# Patient Record
Sex: Male | Born: 1950
Health system: Southern US, Community
[De-identification: ages and names within clinical notes are randomized; demographics above are authoritative.]

## PROBLEM LIST (undated history)

## (undated) DIAGNOSIS — J45909 Unspecified asthma, uncomplicated: Secondary | ICD-10-CM

## (undated) DIAGNOSIS — E785 Hyperlipidemia, unspecified: Secondary | ICD-10-CM

## (undated) DIAGNOSIS — I10 Essential (primary) hypertension: Secondary | ICD-10-CM

## (undated) HISTORY — DX: Unspecified asthma, uncomplicated: J45.909

## (undated) HISTORY — DX: Hyperlipidemia, unspecified: E78.5

---

## 2002-02-13 ENCOUNTER — Emergency Department (HOSPITAL_COMMUNITY): Admission: EM | Admit: 2002-02-13 | Discharge: 2002-02-13 | Payer: Self-pay | Admitting: Emergency Medicine

## 2002-02-13 ENCOUNTER — Encounter: Payer: Self-pay | Admitting: Emergency Medicine

## 2003-04-10 ENCOUNTER — Ambulatory Visit (HOSPITAL_COMMUNITY): Admission: RE | Admit: 2003-04-10 | Discharge: 2003-04-10 | Payer: Self-pay | Admitting: *Deleted

## 2004-10-21 ENCOUNTER — Encounter: Admission: RE | Admit: 2004-10-21 | Discharge: 2004-10-21 | Payer: Self-pay | Admitting: Occupational Medicine

## 2007-07-18 ENCOUNTER — Emergency Department (HOSPITAL_COMMUNITY): Admission: EM | Admit: 2007-07-18 | Discharge: 2007-07-18 | Payer: Self-pay | Admitting: Emergency Medicine

## 2009-07-24 ENCOUNTER — Ambulatory Visit: Payer: Self-pay | Admitting: Internal Medicine

## 2009-07-24 DIAGNOSIS — R5383 Other fatigue: Secondary | ICD-10-CM

## 2009-07-24 DIAGNOSIS — R5381 Other malaise: Secondary | ICD-10-CM | POA: Insufficient documentation

## 2009-07-24 LAB — CONVERTED CEMR LAB
ALT: 16 units/L (ref 0–53)
AST: 14 units/L (ref 0–37)
Albumin: 3.8 g/dL (ref 3.5–5.2)
Alkaline Phosphatase: 44 units/L (ref 39–117)
BUN: 17 mg/dL (ref 6–23)
Basophils Absolute: 0 10*3/uL (ref 0.0–0.1)
Basophils Relative: 0.2 % (ref 0.0–3.0)
Bilirubin, Direct: 0.2 mg/dL (ref 0.0–0.3)
CO2: 27 meq/L (ref 19–32)
Calcium: 8.8 mg/dL (ref 8.4–10.5)
Chloride: 107 meq/L (ref 96–112)
Creatinine, Ser: 1.1 mg/dL (ref 0.4–1.5)
Eosinophils Absolute: 0.1 10*3/uL (ref 0.0–0.7)
Eosinophils Relative: 2.4 % (ref 0.0–5.0)
GFR calc non Af Amer: 90.09 mL/min (ref >60)
Glucose, Bld: 91 mg/dL (ref 70–99)
HCT: 45 % (ref 39.0–52.0)
Hemoglobin: 15.4 g/dL (ref 13.0–17.0)
Lymphocytes Relative: 40.6 % (ref 12.0–46.0)
Lymphs Abs: 2 10*3/uL (ref 0.7–4.0)
MCHC: 34.2 g/dL (ref 30.0–36.0)
MCV: 89.8 fL (ref 78.0–100.0)
Monocytes Absolute: 0.5 10*3/uL (ref 0.1–1.0)
Monocytes Relative: 10.9 % (ref 3.0–12.0)
Neutro Abs: 2.3 10*3/uL (ref 1.4–7.7)
Neutrophils Relative %: 45.9 % (ref 43.0–77.0)
Platelets: 202 10*3/uL (ref 150.0–400.0)
Potassium: 4.1 meq/L (ref 3.5–5.1)
RBC: 5.01 M/uL (ref 4.22–5.81)
RDW: 14.5 % (ref 11.5–14.6)
Sodium: 140 meq/L (ref 135–145)
TSH: 0.28 microintl units/mL — ABNORMAL LOW (ref 0.35–5.50)
Total Bilirubin: 0.8 mg/dL (ref 0.3–1.2)
Total Protein: 6.4 g/dL (ref 6.0–8.3)
WBC: 5.1 10*3/uL (ref 4.5–10.5)

## 2009-07-25 ENCOUNTER — Encounter: Payer: Self-pay | Admitting: Internal Medicine

## 2010-03-31 NOTE — Letter (Signed)
Summary: Results Follow-up Letter  [facility]  [address] [place], [place] [address]   Phone: [phone]  Fax: [phone]    07/25/2009  [address] [place], [place]  [address]  Dear Mr. Passage,   The following are the results of your recent test(s):  Test     Result     CBC       normal Liver/kidney   normal Thyroid     slightly abnormal   _________________________________________________________  Please call for an appointment in 3-4 weeks _________________________________________________________ _________________________________________________________ _________________________________________________________  Sincerely,  Sanda Linger MD [facility]

## 2010-03-31 NOTE — Assessment & Plan Note (Signed)
Summary: NEW [employer] PT--PKG/OFF--#---STC   Vital Signs:  Patient profile:   60 year old male Height:      72 inches Weight:      186 pounds BMI:     25.32 O2 Sat:      96 % on Room air Temp:     98.4 degrees F oral Pulse rate:   75 / minute Pulse rhythm:   regular Resp:     18 per minute BP sitting:   120 / 80  (left arm) Cuff size:   large  Vitals Entered By: Rock Nephew CMA (Jul 24, 2009 3:58 PM)  Nutrition Counseling: Patient's BMI is greater than 25 and therefore counseled on weight management options.  O2 Flow:  Room air  Primary Care Provider:  Etta Grandchild MD   History of Present Illness: New to me he complains of fatigue for 3 years. He had says that he had a physical done by ? MD in [place] one year ago but he does not know what tests were done.  Preventive Screening-Counseling & Management  Alcohol-Tobacco     Alcohol drinks/day: 0     Smoking Status: never  Caffeine-Diet-Exercise     Does Patient Exercise: yes  Hep-HIV-STD-Contraception     Hepatitis Risk: no risk noted     HIV Risk: no risk noted     STD Risk: no risk noted     TSE monthly: yes  Safety-Violence-Falls     Seat Belt Use: yes     Helmet Use: yes     Firearms in the Home: no firearms in the home     Smoke Detectors: yes     Violence in the Home: no risk noted      Sexual History:  currently monogamous.        Drug Use:  no.        Blood Transfusions:  no.    Current Medications (verified): 1)  Fish Oil 2)  Multi Vitamin  Allergies (verified): No Known Drug Allergies  Past History:  Past Medical History: Unremarkable  Past Surgical History: Denies surgical history  Family History: Family History of Alcoholism/Addiction  Social History: Occupation: Midwife Single Never Smoked Alcohol use-no Drug use-no Regular exercise-yes Smoking Status:  never Hepatitis Risk:  no risk noted HIV Risk:  no risk noted STD Risk:  no risk noted Seat Belt Use:  yes Sexual  History:  currently monogamous Blood Transfusions:  no Drug Use:  no Does Patient Exercise:  yes  Review of Systems  The patient denies anorexia, fever, weight loss, weight gain, chest pain, syncope, dyspnea on exertion, peripheral edema, prolonged cough, headaches, hemoptysis, abdominal pain, hematuria, difficulty walking, depression, enlarged lymph nodes, and angioedema.   General:  Complains of fatigue; denies chills, fever, loss of appetite, malaise, sleep disorder, sweats, weakness, and weight loss.  Physical Exam  General:  alert, well-developed, well-nourished, well-hydrated, appropriate dress, normal appearance, healthy-appearing, cooperative to examination, and good hygiene.  alert, well-developed, well-nourished, well-hydrated, appropriate dress, normal appearance, healthy-appearing, cooperative to examination, and good hygiene.   Head:  normocephalic, atraumatic, no abnormalities observed, and no abnormalities palpated.  normocephalic, atraumatic, no abnormalities observed, and no abnormalities palpated.   Eyes:  vision grossly intact, no injection, and no nystagmus.  vision grossly intact, no injection, and no nystagmus.   Mouth:  Oral mucosa and oropharynx without lesions or exudates.  Teeth in good repair. Neck:  supple, full ROM, no masses, no thyromegaly, no JVD, normal carotid  upstroke, no carotid bruits, no cervical lymphadenopathy, and no neck tenderness.  supple, full ROM, no masses, no thyromegaly, no JVD, normal carotid upstroke, no carotid bruits, no cervical lymphadenopathy, and no neck tenderness.   Lungs:  normal respiratory effort, no intercostal retractions, no accessory muscle use, normal breath sounds, no dullness, no fremitus, no crackles, and no wheezes.  normal respiratory effort, no intercostal retractions, no accessory muscle use, normal breath sounds, no dullness, no fremitus, no crackles, and no wheezes.   Heart:  normal rate, regular rhythm, no murmur, no  gallop, no rub, and no JVD.  normal rate, regular rhythm, no murmur, no gallop, no rub, and no JVD.   Abdomen:  soft, non-tender, normal bowel sounds, no distention, no masses, no guarding, no rigidity, no rebound tenderness, no abdominal hernia, no inguinal hernia, no hepatomegaly, and no splenomegaly.  soft, non-tender, normal bowel sounds, no distention, no masses, no guarding, no rigidity, no rebound tenderness, no abdominal hernia, no inguinal hernia, no hepatomegaly, and no splenomegaly.   Msk:  normal ROM, no joint tenderness, no joint swelling, no joint warmth, no redness over joints, no joint deformities, no joint instability, no crepitation, and no muscle atrophy.  normal ROM, no joint tenderness, no joint swelling, no joint warmth, no redness over joints, no joint deformities, no joint instability, no crepitation, and no muscle atrophy.   Pulses:  R and L carotid,radial,femoral,dorsalis pedis and posterior tibial pulses are full and equal bilaterally Extremities:  No clubbing, cyanosis, edema, or deformity noted with normal full range of motion of all joints.   Neurologic:  No cranial nerve deficits noted. Station and gait are normal. Plantar reflexes are down-going bilaterally. DTRs are symmetrical throughout. Sensory, motor and coordinative functions appear intact. Skin:  turgor normal, color normal, no rashes, no suspicious lesions, no ecchymoses, no petechiae, no purpura, no ulcerations, and no edema.  turgor normal, color normal, no rashes, no suspicious lesions, no ecchymoses, no petechiae, no purpura, no ulcerations, and no edema.   Cervical Nodes:  no anterior cervical adenopathy and no posterior cervical adenopathy.  no anterior cervical adenopathy and no posterior cervical adenopathy.   Axillary Nodes:  no R axillary adenopathy and no L axillary adenopathy.  no R axillary adenopathy and no L axillary adenopathy.   Inguinal Nodes:  no R inguinal adenopathy and no L inguinal adenopathy.   no R inguinal adenopathy and no L inguinal adenopathy.   Psych:  Cognition and judgment appear intact. Alert and cooperative with normal attention span and concentration. No apparent delusions, illusions, hallucinations   Impression & Recommendations:  Problem # 1:  FATIGUE, ACUTE (ICD-780.79) Assessment New this does not sound pathological since he has had fatigue for years with no other systemic complaints, will look at labs today for organic illness. Orders: Venipuncture ([address]454) TLB-BMP (Basic Metabolic Panel-BMET) (80048-METABOL) TLB-CBC Platelet - w/Differential (85025-CBCD) TLB-Hepatic/Liver Function Pnl (80076-HEPATIC) TLB-TSH (Thyroid Stimulating Hormone) (84443-TSH)  Complete Medication List: 1)  Fish Oil  2)  Multi Vitamin   Patient Instructions: 1)  Please schedule a follow-up appointment in 1 month for a full physical with fasting labs.   Tetanus/Td Immunization History:    Tetanus/Td # 1:  Tdap (06/30/2006)

## 2010-11-25 LAB — URINE CULTURE
Colony Count: NO GROWTH
Culture: NO GROWTH

## 2010-11-25 LAB — POCT URINALYSIS DIP (DEVICE)
Glucose, UA: NEGATIVE
Ketones, ur: 40 — AB
Nitrite: NEGATIVE
Operator id: 282151
Protein, ur: 100 — AB
Specific Gravity, Urine: 1.02
Urobilinogen, UA: 1
pH: 5.5

## 2010-11-25 LAB — GC/CHLAMYDIA PROBE AMP, GENITAL: GC Probe Amp, Genital: NEGATIVE

## 2012-12-24 ENCOUNTER — Emergency Department (HOSPITAL_COMMUNITY)
Admission: EM | Admit: 2012-12-24 | Discharge: 2012-12-24 | Discharge status: Against Medical Advice | Payer: BC Managed Care – PPO | Attending: Emergency Medicine | Admitting: Emergency Medicine

## 2012-12-24 ENCOUNTER — Emergency Department (HOSPITAL_COMMUNITY): Payer: BC Managed Care – PPO

## 2012-12-24 ENCOUNTER — Encounter (HOSPITAL_COMMUNITY): Payer: Self-pay | Admitting: Emergency Medicine

## 2012-12-24 DIAGNOSIS — M25519 Pain in unspecified shoulder: Secondary | ICD-10-CM | POA: Insufficient documentation

## 2012-12-24 DIAGNOSIS — R079 Chest pain, unspecified: Secondary | ICD-10-CM | POA: Diagnosis present

## 2012-12-24 DIAGNOSIS — M79602 Pain in left arm: Secondary | ICD-10-CM

## 2012-12-24 DIAGNOSIS — N289 Disorder of kidney and ureter, unspecified: Secondary | ICD-10-CM | POA: Diagnosis present

## 2012-12-24 LAB — POCT I-STAT TROPONIN I: Troponin i, poc: 0.01 ng/mL (ref 0.00–0.08)

## 2012-12-24 LAB — POCT I-STAT, CHEM 8
BUN: 20 mg/dL (ref 6–23)
Calcium, Ion: 1.2 mmol/L (ref 1.13–1.30)
Chloride: 105 mEq/L (ref 96–112)
HCT: 47 % (ref 39.0–52.0)
Sodium: 141 mEq/L (ref 135–145)
TCO2: 23 mmol/L (ref 0–100)

## 2012-12-24 LAB — CBC
MCH: 30.2 pg (ref 26.0–34.0)
Platelets: 181 10*3/uL (ref 150–400)
RBC: 5.07 MIL/uL (ref 4.22–5.81)
RDW: 13.5 % (ref 11.5–15.5)
WBC: 5 10*3/uL (ref 4.0–10.5)

## 2012-12-24 MED ORDER — ASPIRIN 81 MG PO CHEW: 81.0000 mg | CHEWABLE_TABLET | Freq: Every day | ORAL | Status: DC

## 2012-12-24 MED ORDER — CYCLOBENZAPRINE HCL 10 MG PO TABS: 10.0000 mg | ORAL_TABLET | Freq: Two times a day (BID) | ORAL | Status: DC | PRN

## 2012-12-24 MED ORDER — ASPIRIN 81 MG PO CHEW
324.0000 mg | CHEWABLE_TABLET | Freq: Once | ORAL | Status: AC
  Administered 2012-12-24: 324 mg via ORAL
  Filled 2012-12-24: qty 4

## 2012-12-24 MED ORDER — ONDANSETRON HCL 4 MG/2ML IJ SOLN
4.0000 mg | Freq: Once | INTRAMUSCULAR | Status: AC
  Administered 2012-12-24: 4 mg via INTRAVENOUS
  Filled 2012-12-24: qty 2

## 2012-12-24 MED ORDER — MORPHINE SULFATE 4 MG/ML IJ SOLN
4.0000 mg | Freq: Once | INTRAMUSCULAR | Status: AC
  Administered 2012-12-24: 4 mg via INTRAVENOUS
  Filled 2012-12-24: qty 1

## 2012-12-24 NOTE — ED Notes (Signed)
Pt arrived to ED with a complaint of arm pain.  Pt states that it feels like an electrical pulse that runs down from his shoulder to his hand intermittently.  Pt states the last time he felt it was prior to entrance to the ED.  Pt states pain has been effecting him for 2 weeks

## 2012-12-24 NOTE — ED Notes (Signed)
Patient left AMA.  He was notified of the dangers of leaving and was instructed to return if his symptoms return He was not showing any signs of distress on DC

## 2012-12-24 NOTE — ED Provider Notes (Addendum)
CSN: [id number]     Arrival date & time 12/24/12  0117 History   First MD Initiated Contact with Patient 12/24/12 0210     Chief Complaint  Patient presents with  . Extremity Pain   (Consider location/radiation/quality/duration/timing/severity/associated sxs/prior Treatment) HPI History provided by patient. Has been having left arm pain for the last few days, described as soreness. Tonight he also developed associated chest pain. No known aggravating or alleviating factors. No shortness of breath. No nausea vomiting. No diaphoresis. Patient has not seen a doctor in some time. He denies any history of diabetes, hypertension hyperlipidemia and known history of heart disease. Symptoms mild/moderate in severity.  History reviewed. No pertinent past medical history. History reviewed. No pertinent past surgical history. History reviewed. No pertinent family history. History  Substance Use Topics  . Smoking status: Never Smoker   . Smokeless tobacco: Not on file  . Alcohol Use: No    Review of Systems  Constitutional: Negative for fever and chills.  Respiratory: Negative for shortness of breath.   Cardiovascular: Positive for chest pain.  Gastrointestinal: Negative for abdominal pain.  Genitourinary: Negative for dysuria.  Musculoskeletal: Negative for back pain, neck pain and neck stiffness.  Skin: Negative for rash.  Neurological: Negative for headaches.  All other systems reviewed and are negative.    Allergies  Review of patient's allergies indicates no known allergies.  Home Medications   Current Outpatient Rx  Name  Route  Sig  Dispense  Refill  . ibuprofen (ADVIL,MOTRIN) 200 MG tablet   Oral   Take 200-400 mg by mouth every 6 (six) hours as needed for pain or headache.          BP 116/70  Pulse 65  Temp(Src) 97.6 F (36.4 C) (Oral)  Resp 18  Wt 172 lb (78.019 kg)  BMI 23.32 kg/m2  SpO2 98% Physical Exam  Constitutional: He is oriented to person, place, and  time. He appears well-developed and well-nourished.  HENT:  Head: Normocephalic and atraumatic.  Eyes: EOM are normal. Pupils are equal, round, and reactive to light.  Neck: Neck supple.  No C-spine tenderness or deformity  Cardiovascular: Normal rate, regular rhythm and intact distal pulses.   Pulmonary/Chest: Effort normal and breath sounds normal. No respiratory distress. He exhibits no tenderness.  Abdominal: Soft. Bowel sounds are normal. He exhibits no distension. There is no tenderness. There is no rebound and no guarding.  Musculoskeletal: Normal range of motion. He exhibits no edema.  No tenderness to left upper extremity with good range of motion and no reproducible symptoms.  Neurological: He is alert and oriented to person, place, and time.  Skin: Skin is warm and dry.    ED Course  Procedures (including critical care time) Labs Review Labs Reviewed  POCT I-STAT, CHEM 8 - Abnormal; Notable for the following:    Creatinine, Ser 1.40 (*)    Glucose, Bld 122 (*)    All other components within normal limits  CBC  POCT I-STAT TROPONIN I   Imaging Review Dg Chest 2 View  12/24/2012   CLINICAL DATA:  New onset of mid chest discomfort.  EXAM: CHEST  2 VIEW  COMPARISON:  None.  FINDINGS: The heart size and mediastinal contours are within normal limits. Both lungs are clear. The visualized skeletal structures are unremarkable.  IMPRESSION: No active cardiopulmonary disease.   Electronically Signed   By: Roanna Raider M.D.   On: 12/24/2012 03:26   Dg Cervical Spine Complete  12/24/2012  CLINICAL DATA:  New posterior and left-sided neck pain, radiating down left arm, with numbness and tingling.  EXAM: CERVICAL SPINE  4+ VIEWS  COMPARISON:  None.  FINDINGS: There is no evidence of cervical spine fracture or prevertebral soft tissue swelling. Alignment is normal. Mild disc space narrowing is noted along the mid to lower cervical spine. No other significant bone abnormalities are  identified.  IMPRESSION: No evidence of fracture or subluxation along the cervical spine.   Electronically Signed   By: Roanna Raider M.D.   On: 12/24/2012 03:28    Date: 12/24/2012  Rate: 69  Rhythm: normal sinus rhythm  QRS Axis: normal  Intervals: normal  ST/T Wave abnormalities: nonspecific ST changes  Conduction Disutrbances:none  Narrative Interpretation: Normal sinus with PVCs and nonspecific ST changes  Old EKG Reviewed: none available  Aspirin. IV morphine. Discussed with hospitalist, will evaluate for admission MDM  Diagnosis: Chest pain EKG, labs, chest x-ray Aspirin IV narcotics pain control MED admit  Sunnie Nielsen, MD 12/24/12 0522    6:17 AM patient declines admission. He prefers followup outpatient with referral provided. He states understanding risks and benefits of admission, risk of death or serious disability should his symptoms represent a heart attack or other serious medical emergency.  He is alert and oriented x4. Patient left the hospital AMA. Referral and prescription provided. Patient aware that he can return any time he changes his mind or wishes to be evaluated again  Sunnie Nielsen, MD 12/24/12 [phone]

## 2015-01-06 ENCOUNTER — Emergency Department (HOSPITAL_COMMUNITY)
Admission: EM | Admit: 2015-01-06 | Discharge: 2015-01-06 | Disposition: A | Discharge status: Home / Self Care | Payer: Managed Care, Other (non HMO) | Source: Home / Self Care

## 2015-01-06 ENCOUNTER — Encounter (HOSPITAL_COMMUNITY): Payer: Self-pay | Admitting: Emergency Medicine

## 2015-01-06 DIAGNOSIS — J069 Acute upper respiratory infection, unspecified: Secondary | ICD-10-CM

## 2015-01-06 MED ORDER — AZITHROMYCIN 250 MG PO TABS
ORAL_TABLET | ORAL | Status: DC
Start: 2015-01-06 — End: 2015-01-14

## 2015-01-06 NOTE — ED Notes (Signed)
Pt has been suffering from nasal congestion and a headache since yesterday.  Pt denies any fever.

## 2015-01-06 NOTE — ED Provider Notes (Signed)
CSN: [id number]     Arrival date & time 01/06/15  1403 History   None    Chief Complaint  Patient presents with  . Nasal Congestion  . Headache   (Consider location/radiation/quality/duration/timing/severity/associated sxs/prior Treatment) HPI Comments: Patient he mainly has cold sx's and sinus congestion.  Patient is a 64 y.o. male presenting with headaches. The history is provided by the patient.  Headache Pain location:  Generalized Quality:  Dull Radiates to:  Does not radiate Severity currently:  2/10 Onset quality:  Sudden Timing:  Constant   History reviewed. No pertinent past medical history. History reviewed. No pertinent past surgical history. History reviewed. No pertinent family history. Social History  Substance Use Topics  . Smoking status: Never Smoker   . Smokeless tobacco: None  . Alcohol Use: No    Review of Systems  Constitutional: Negative.   Respiratory: Negative.   Cardiovascular: Negative.   Gastrointestinal: Negative.   Endocrine: Negative.   Genitourinary: Negative.   Musculoskeletal: Negative.   Skin: Negative.   Allergic/Immunologic: Negative.   Neurological: Positive for headaches.  Hematological: Negative.   Psychiatric/Behavioral: Negative.     Allergies  Review of patient's allergies indicates no known allergies.  Home Medications   Prior to Admission medications   Medication Sig Start Date End Date Taking? Authorizing Provider  aspirin 81 MG chewable tablet Chew 1 tablet (81 mg total) by mouth daily. 12/24/12   Sunnie NielsenBrian Opitz, MD  azithromycin (ZITHROMAX) 250 MG tablet Take 2 po first day and then one po qd x 4 days 01/06/15   Deatra CanterWilliam J Oxford, FNP  cyclobenzaprine (FLEXERIL) 10 MG tablet Take 1 tablet (10 mg total) by mouth 2 (two) times daily as needed for muscle spasms. 12/24/12   Sunnie NielsenBrian Opitz, MD  ibuprofen (ADVIL,MOTRIN) 200 MG tablet Take 200-400 mg by mouth every 6 (six) hours as needed for pain or headache.    Historical  Provider, MD   Meds Ordered and Administered this Visit  Medications - No data to display  BP 151/81 mmHg  Pulse 72  Temp(Src) 98 F (36.7 C) (Oral)  Resp 16  SpO2 100% No data found.   Physical Exam  Constitutional: He is oriented to person, place, and time. He appears well-developed and well-nourished.  HENT:  Head: Normocephalic and atraumatic.  Right Ear: External ear normal.  Left Ear: External ear normal.  Mouth/Throat: Oropharynx is clear and moist.  Eyes: Conjunctivae and EOM are normal. Pupils are equal, round, and reactive to light.  Neck: Normal range of motion.  Cardiovascular: Normal rate, regular rhythm and normal heart sounds.   Pulmonary/Chest: Effort normal and breath sounds normal.  Abdominal: Soft. Bowel sounds are normal.  Neurological: He is alert and oriented to person, place, and time.  Skin: Skin is warm and dry.    ED Course  Procedures (including critical care time)  Labs Review Labs Reviewed - No data to display  Imaging Review No results found.   Visual Acuity Review  Right Eye Distance:   Left Eye Distance:   Bilateral Distance:    Right Eye Near:   Left Eye Near:    Bilateral Near:         MDM   1. URI, acute    Zpak as directed  Note for work today  Push po fluids, rest, tylenol and motrin otc prn as directed for fever, arthralgias, and myalgias.  Follow up prn if sx's continue or persist.  Deatra CanterWilliam J Oxford FNP    Chrissie NoaWilliam  Sabino Gasser, FNP 01/06/15 1640

## 2015-01-14 ENCOUNTER — Ambulatory Visit (INDEPENDENT_AMBULATORY_CARE_PROVIDER_SITE_OTHER): Payer: Managed Care, Other (non HMO) | Admitting: Nurse Practitioner

## 2015-01-14 ENCOUNTER — Encounter: Payer: Self-pay | Admitting: Nurse Practitioner

## 2015-01-14 VITALS — BP 134/88 | HR 71 | Temp 98.3°F | Resp 14 | Ht 71.5 in | Wt 202.0 lb

## 2015-01-14 DIAGNOSIS — Z1211 Encounter for screening for malignant neoplasm of colon: Secondary | ICD-10-CM | POA: Diagnosis not present

## 2015-01-14 DIAGNOSIS — Z0000 Encounter for general adult medical examination without abnormal findings: Secondary | ICD-10-CM | POA: Diagnosis not present

## 2015-01-14 DIAGNOSIS — H6122 Impacted cerumen, left ear: Secondary | ICD-10-CM | POA: Diagnosis not present

## 2015-01-14 DIAGNOSIS — M79644 Pain in right finger(s): Secondary | ICD-10-CM | POA: Diagnosis not present

## 2015-01-14 NOTE — Progress Notes (Signed)
Patient ID: Antonio Washington, male   DOB: 07-27-1950, 64 y.o.   MRN: [id number]    PCP: Sanda Linger, MD  Advanced Directive information Does patient have an advance directive?: No, Would patient like information on creating an advanced directive?: No - patient declined information  No Known Allergies  Chief Complaint  Patient presents with  . Establish Care    New patient establish care, requesting prostate exam   . Medical Management of Chronic Issues     HPI: Patient is a 64 y.o. male seen in the office today to establish care. Had not had routine care in years.  Not fasting.  Screening colonoscopy in Jul 27, 2002 Father died last year of colon caner at age 43 No recent vaccine, does not get flu vaccine Had a cold a week and a half ago, this has resolved . Exercise- not regularly Diet- does not eat pork, attempts heart healthy  Had not had routine dental care.   Reports pain to right thumb for the past 3 weeks, no injury noted. Worse in the morning and with movement. Would like to see ortho.  Review of Systems:  Review of Systems  Constitutional: Negative for activity change, appetite change, fatigue and unexpected weight change.  HENT: Negative for congestion and hearing loss.   Eyes: Negative.   Respiratory: Negative for cough and shortness of breath.   Cardiovascular: Negative for chest pain, palpitations and leg swelling.  Gastrointestinal: Negative for abdominal pain, diarrhea and constipation.  Genitourinary: Negative for dysuria and difficulty urinating.       Gets up 0-2 times at night to urinate   Musculoskeletal: Positive for arthralgias (to right thumb). Negative for myalgias.  Skin: Negative for color change and wound.  Neurological: Negative for dizziness and weakness.  Psychiatric/Behavioral: Negative for behavioral problems, confusion and agitation.    History reviewed. No pertinent past medical history. History reviewed. No pertinent past surgical  history. Social History:   reports that he has never smoked. He has never used smokeless tobacco. He reports that he does not drink alcohol or use illicit drugs.  History reviewed. No pertinent family history.  Medications: Patient's Medications  New Prescriptions   No medications on file  Previous Medications   No medications on file  Modified Medications   No medications on file  Discontinued Medications   ASPIRIN 81 MG CHEWABLE TABLET    Chew 1 tablet (81 mg total) by mouth daily.   AZITHROMYCIN (ZITHROMAX) 250 MG TABLET    Take 2 po first day and then one po qd x 4 days   CYCLOBENZAPRINE (FLEXERIL) 10 MG TABLET    Take 1 tablet (10 mg total) by mouth 2 (two) times daily as needed for muscle spasms.   IBUPROFEN (ADVIL,MOTRIN) 200 MG TABLET    Take 200-400 mg by mouth every 6 (six) hours as needed for pain or headache.     Physical Exam:  Filed Vitals:   01/14/15 0907  BP: 134/88  Pulse: 71  Temp: 98.3 F (36.8 C)  TempSrc: Oral  Resp: 14  Height: 5' 11.5" (1.816 m)  Weight: 202 lb (91.627 kg)  SpO2: 97%   Body mass index is 27.78 kg/(m^2).  Physical Exam  Constitutional: He is oriented to person, place, and time. He appears well-developed and well-nourished. No distress.  HENT:  Head: Normocephalic and atraumatic.  Mouth/Throat: Oropharynx is clear and moist. No oropharyngeal exudate.  Cerumen impaction to left ear  Eyes: Conjunctivae and EOM are normal. Pupils are equal,  round, and reactive to light.  Neck: Normal range of motion. Neck supple.  Cardiovascular: Normal rate, regular rhythm and normal heart sounds.   Pulmonary/Chest: Effort normal and breath sounds normal.  Abdominal: Soft. Bowel sounds are normal.  Musculoskeletal: He exhibits no edema or tenderness.  Joint pain noted to base of right thumb also into right hand and MPC joint popping noted  Neurological: He is alert and oriented to person, place, and time.  Skin: Skin is warm and dry. He is not  diaphoretic.  Psychiatric: He has a normal mood and affect.    Labs reviewed: Basic Metabolic Panel: No results for input(s): NA, K, CL, CO2, GLUCOSE, BUN, CREATININE, CALCIUM, MG, PHOS, TSH in the last 8760 hours. Liver Function Tests: No results for input(s): AST, ALT, ALKPHOS, BILITOT, PROT, ALBUMIN in the last 8760 hours. No results for input(s): LIPASE, AMYLASE in the last 8760 hours. No results for input(s): AMMONIA in the last 8760 hours. CBC: No results for input(s): WBC, NEUTROABS, HGB, HCT, MCV, PLT in the last 8760 hours. Lipid Panel: No results for input(s): CHOL, HDL, LDLCALC, TRIG, CHOLHDL, LDLDIRECT in the last 8760 hours. TSH: No results for input(s): TSH in the last 8760 hours. A1C: No results found for: HGBA1C   Assessment/Plan 1. Preventative health care The patient is doing well and no distinct problems were identified on exam. The patient was counseled regarding the appropriate use of alcohol, prevention of dental and periodontal disease, diet, regular sustained exercise for at least 30 minutes 5 times per week, testicular self-examination on a monthly basis,smoking cessation, tobacco use,  and recommended schedule for GI hemoccult testing, colonoscopy, cholesterol, thyroid and diabetes screening. - PSA - Lipid Panel - Comprehensive metabolic panel - CBC with Differential  2. Special screening for malignant neoplasms, colon -due for screening colonoscopy - Ambulatory referral to Gastroenterology  3. Cerumen impaction, left -irrigated with saline with metal syringe, pt tolerated well   4. Thumb pain, right -ongoing for over 3 weeks, no injury, possible arthritis. Will get xray at this time to evaluate, may need ortho evaluation  - DG Hand Complete Right; Future  Jessica K. Biagio BorgEubanks, AGNP  [facility] [phone](Monday-Friday 8 am - 5 pm) [phone] (after hours)

## 2015-01-14 NOTE — Patient Instructions (Signed)
Follow up in 6 months with Dr Eulas Post   Heart healthy/low sodium diet  Exercise 30 mins 5 days a week    Preventive Care for Adults, Male A healthy lifestyle and preventive care can promote health and wellness. Preventive health guidelines for men include the following key practices:  A routine yearly physical is a good way to check with your health care provider about your health and preventative screening. It is a chance to share any concerns and updates on your health and to receive a thorough exam.  Visit your dentist for a routine exam and preventative care every 6 months. Brush your teeth twice a day and floss once a day. Good oral hygiene prevents tooth decay and gum disease.  The frequency of eye exams is based on your age, health, family medical history, use of contact lenses, and other factors. Follow your health care provider's recommendations for frequency of eye exams.  Eat a healthy diet. Foods such as vegetables, fruits, whole grains, low-fat dairy products, and lean protein foods contain the nutrients you need without too many calories. Decrease your intake of foods high in solid fats, added sugars, and salt. Eat the right amount of calories for you.Get information about a proper diet from your health care provider, if necessary.  Regular physical exercise is one of the most important things you can do for your health. Most adults should get at least 150 minutes of moderate-intensity exercise (any activity that increases your heart rate and causes you to sweat) each week. In addition, most adults need muscle-strengthening exercises on 2 or more days a week.  Maintain a healthy weight. The body mass index (BMI) is a screening tool to identify possible weight problems. It provides an estimate of body fat based on height and weight. Your health care provider can find your BMI and can help you achieve or maintain a healthy weight.For adults 20 years and older:  A BMI below 18.5 is  considered underweight.  A BMI of 18.5 to 24.9 is normal.  A BMI of 25 to 29.9 is considered overweight.  A BMI of 30 and above is considered obese.  Maintain normal blood lipids and cholesterol levels by exercising and minimizing your intake of saturated fat. Eat a balanced diet with plenty of fruit and vegetables. Blood tests for lipids and cholesterol should begin at age 45 and be repeated every 5 years. If your lipid or cholesterol levels are high, you are over 50, or you are at high risk for heart disease, you may need your cholesterol levels checked more frequently.Ongoing high lipid and cholesterol levels should be treated with medicines if diet and exercise are not working.  If you smoke, find out from your health care provider how to quit. If you do not use tobacco, do not start.  Lung cancer screening is recommended for adults aged 3-80 years who are at high risk for developing lung cancer because of a history of smoking. A yearly low-dose CT scan of the lungs is recommended for people who have at least a 30-pack-year history of smoking and are a current smoker or have quit within the past 15 years. A pack year of smoking is smoking an average of 1 pack of cigarettes a day for 1 year (for example: 1 pack a day for 30 years or 2 packs a day for 15 years). Yearly screening should continue until the smoker has stopped smoking for at least 15 years. Yearly screening should be stopped for people  who develop a health problem that would prevent them from having lung cancer treatment.  If you choose to drink alcohol, do not have more than 2 drinks per day. One drink is considered to be 12 ounces (355 mL) of beer, 5 ounces (148 mL) of wine, or 1.5 ounces (44 mL) of liquor.  Avoid use of street drugs. Do not share needles with anyone. Ask for help if you need support or instructions about stopping the use of drugs.  High blood pressure causes heart disease and increases the risk of stroke. Your  blood pressure should be checked at least every 1-2 years. Ongoing high blood pressure should be treated with medicines, if weight loss and exercise are not effective.  If you are 22-57 years old, ask your health care provider if you should take aspirin to prevent heart disease.  Diabetes screening is done by taking a blood sample to check your blood glucose level after you have not eaten for a certain period of time (fasting). If you are not overweight and you do not have risk factors for diabetes, you should be screened once every 3 years starting at age 40. If you are overweight or obese and you are 58-49 years of age, you should be screened for diabetes every year as part of your cardiovascular risk assessment.  Colorectal cancer can be detected and often prevented. Most routine colorectal cancer screening begins at the age of 33 and continues through age 69. However, your health care provider may recommend screening at an earlier age if you have risk factors for colon cancer. On a yearly basis, your health care provider may provide home test kits to check for hidden blood in the stool. Use of a small camera at the end of a tube to directly examine the colon (sigmoidoscopy or colonoscopy) can detect the earliest forms of colorectal cancer. Talk to your health care provider about this at age 27, when routine screening begins. Direct exam of the colon should be repeated every 5-10 years through age 68, unless early forms of precancerous polyps or small growths are found.  People who are at an increased risk for hepatitis B should be screened for this virus. You are considered at high risk for hepatitis B if:  You were born in a country where hepatitis B occurs often. Talk with your health care provider about which countries are considered high risk.  Your parents were born in a high-risk country and you have not received a shot to protect against hepatitis B (hepatitis B vaccine).  You have HIV or  AIDS.  You use needles to inject street drugs.  You live with, or have sex with, someone who has hepatitis B.  You are a man who has sex with other men (MSM).  You get hemodialysis treatment.  You take certain medicines for conditions such as cancer, organ transplantation, and autoimmune conditions.  Hepatitis C blood testing is recommended for all people born from [birth year] through 1965 and any individual with known risks for hepatitis C.  Practice safe sex. Use condoms and avoid high-risk sexual practices to reduce the spread of sexually transmitted infections (STIs). STIs include gonorrhea, chlamydia, syphilis, trichomonas, herpes, HPV, and human immunodeficiency virus (HIV). Herpes, HIV, and HPV are viral illnesses that have no cure. They can result in disability, cancer, and death.  If you are a man who has sex with other men, you should be screened at least once per year for:  HIV.  Urethral, rectal, and  pharyngeal infection of gonorrhea, chlamydia, or both.  If you are at risk of being infected with HIV, it is recommended that you take a prescription medicine daily to prevent HIV infection. This is called preexposure prophylaxis (PrEP). You are considered at risk if:  You are a man who has sex with other men (MSM) and have other risk factors.  You are a heterosexual man, are sexually active, and are at increased risk for HIV infection.  You take drugs by injection.  You are sexually active with a partner who has HIV.  Talk with your health care provider about whether you are at high risk of being infected with HIV. If you choose to begin PrEP, you should first be tested for HIV. You should then be tested every 3 months for as long as you are taking PrEP.  A one-time screening for abdominal aortic aneurysm (AAA) and surgical repair of large AAAs by ultrasound are recommended for men ages 86 to 48 years who are current or former smokers.  Healthy men should no longer receive  prostate-specific antigen (PSA) blood tests as part of routine cancer screening. Talk with your health care provider about prostate cancer screening.  Testicular cancer screening is not recommended for adult males who have no symptoms. Screening includes self-exam, a health care provider exam, and other screening tests. Consult with your health care provider about any symptoms you have or any concerns you have about testicular cancer.  Use sunscreen. Apply sunscreen liberally and repeatedly throughout the day. You should seek shade when your shadow is shorter than you. Protect yourself by wearing long sleeves, pants, a wide-brimmed hat, and sunglasses year round, whenever you are outdoors.  Once a month, do a whole-body skin exam, using a mirror to look at the skin on your back. Tell your health care provider about new moles, moles that have irregular borders, moles that are larger than a pencil eraser, or moles that have changed in shape or color.  Stay current with required vaccines (immunizations).  Influenza vaccine. All adults should be immunized every year.  Tetanus, diphtheria, and acellular pertussis (Td, Tdap) vaccine. An adult who has not previously received Tdap or who does not know his vaccine status should receive 1 dose of Tdap. This initial dose should be followed by tetanus and diphtheria toxoids (Td) booster doses every 10 years. Adults with an unknown or incomplete history of completing a 3-dose immunization series with Td-containing vaccines should begin or complete a primary immunization series including a Tdap dose. Adults should receive a Td booster every 10 years.  Varicella vaccine. An adult without evidence of immunity to varicella should receive 2 doses or a second dose if he has previously received 1 dose.  Human papillomavirus (HPV) vaccine. Males aged 11-21 years who have not received the vaccine previously should receive the 3-dose series. Males aged 22-26 years may be  immunized. Immunization is recommended through the age of 22 years for any male who has sex with males and did not get any or all doses earlier. Immunization is recommended for any person with an immunocompromised condition through the age of 52 years if he did not get any or all doses earlier. During the 3-dose series, the second dose should be obtained 4-8 weeks after the first dose. The third dose should be obtained 24 weeks after the first dose and 16 weeks after the second dose.  Zoster vaccine. One dose is recommended for adults aged 77 years or older unless certain conditions  are present.  Measles, mumps, and rubella (MMR) vaccine. Adults born before [birth year] generally are considered immune to measles and mumps. Adults born in [birth year] or later should have 1 or more doses of MMR vaccine unless there is a contraindication to the vaccine or there is laboratory evidence of immunity to each of the three diseases. A routine second dose of MMR vaccine should be obtained at least 28 days after the first dose for students attending postsecondary schools, health care workers, or international travelers. People who received inactivated measles vaccine or an unknown type of measles vaccine during 1963-1967 should receive 2 doses of MMR vaccine. People who received inactivated mumps vaccine or an unknown type of mumps vaccine before 1979 and are at high risk for mumps infection should consider immunization with 2 doses of MMR vaccine. Unvaccinated health care workers born before [birth year] who lack laboratory evidence of measles, mumps, or rubella immunity or laboratory confirmation of disease should consider measles and mumps immunization with 2 doses of MMR vaccine or rubella immunization with 1 dose of MMR vaccine.  Pneumococcal 13-valent conjugate (PCV13) vaccine. When indicated, a person who is uncertain of his immunization history and has no record of immunization should receive the PCV13 vaccine. All adults 67 years of  age and older should receive this vaccine. An adult aged 38 years or older who has certain medical conditions and has not been previously immunized should receive 1 dose of PCV13 vaccine. This PCV13 should be followed with a dose of pneumococcal polysaccharide (PPSV23) vaccine. Adults who are at high risk for pneumococcal disease should obtain the PPSV23 vaccine at least 8 weeks after the dose of PCV13 vaccine. Adults older than 64 years of age who have normal immune system function should obtain the PPSV23 vaccine dose at least 1 year after the dose of PCV13 vaccine.  Pneumococcal polysaccharide (PPSV23) vaccine. When PCV13 is also indicated, PCV13 should be obtained first. All adults aged 29 years and older should be immunized. An adult younger than age 55 years who has certain medical conditions should be immunized. Any person who resides in a nursing home or long-term care facility should be immunized. An adult smoker should be immunized. People with an immunocompromised condition and certain other conditions should receive both PCV13 and PPSV23 vaccines. People with human immunodeficiency virus (HIV) infection should be immunized as soon as possible after diagnosis. Immunization during chemotherapy or radiation therapy should be avoided. Routine use of PPSV23 vaccine is not recommended for American Indians, [place] Natives, or people younger than 65 years unless there are medical conditions that require PPSV23 vaccine. When indicated, people who have unknown immunization and have no record of immunization should receive PPSV23 vaccine. One-time revaccination 5 years after the first dose of PPSV23 is recommended for people aged 19-64 years who have chronic kidney failure, nephrotic syndrome, asplenia, or immunocompromised conditions. People who received 1-2 doses of PPSV23 before age 83 years should receive another dose of PPSV23 vaccine at age 56 years or later if at least 5 years have passed since the  previous dose. Doses of PPSV23 are not needed for people immunized with PPSV23 at or after age 64 years.  Meningococcal vaccine. Adults with asplenia or persistent complement component deficiencies should receive 2 doses of quadrivalent meningococcal conjugate (MenACWY-D) vaccine. The doses should be obtained at least 2 months apart. Microbiologists working with certain meningococcal bacteria, [employer] recruits, people at risk during an outbreak, and people who travel to or live in countries with a high  rate of meningitis should be immunized. A first-year college student up through age 69 years who is living in a residence hall should receive a dose if he did not receive a dose on or after his 16th birthday. Adults who have certain high-risk conditions should receive one or more doses of vaccine.  Hepatitis A vaccine. Adults who wish to be protected from this disease, have chronic liver disease, work with hepatitis A-infected animals, work in hepatitis A research labs, or travel to or work in countries with a high rate of hepatitis A should be immunized. Adults who were previously unvaccinated and who anticipate close contact with an international adoptee during the first 60 days after arrival in the [place] from a country with a high rate of hepatitis A should be immunized.  Hepatitis B vaccine. Adults should be immunized if they wish to be protected from this disease, are under age 36 years and have diabetes, have chronic liver disease, have had more than one sex partner in the past 6 months, may be exposed to blood or other infectious body fluids, are household contacts or sex partners of hepatitis B positive people, are clients or workers in certain care facilities, or travel to or work in countries with a high rate of hepatitis B.  Haemophilus influenzae type b (Hib) vaccine. A previously unvaccinated person with asplenia or sickle cell disease or having a scheduled splenectomy should receive 1  dose of Hib vaccine. Regardless of previous immunization, a recipient of a hematopoietic stem cell transplant should receive a 3-dose series 6-12 months after his successful transplant. Hib vaccine is not recommended for adults with HIV infection. Preventive Service / Frequency Ages 65 to 70  Blood pressure check.** / Every 3-5 years.  Lipid and cholesterol check.** / Every 5 years beginning at age 52.  Hepatitis C blood test.** / For any individual with known risks for hepatitis C.  Skin self-exam. / Monthly.  Influenza vaccine. / Every year.  Tetanus, diphtheria, and acellular pertussis (Tdap, Td) vaccine.** / Consult your health care provider. 1 dose of Td every 10 years.  Varicella vaccine.** / Consult your health care provider.  HPV vaccine. / 3 doses over 6 months, if 89 or younger.  Measles, mumps, rubella (MMR) vaccine.** / You need at least 1 dose of MMR if you were born in 1957 or later. You may also need a second dose.  Pneumococcal 13-valent conjugate (PCV13) vaccine.** / Consult your health care provider.  Pneumococcal polysaccharide (PPSV23) vaccine.** / 1 to 2 doses if you smoke cigarettes or if you have certain conditions.  Meningococcal vaccine.** / 1 dose if you are age 16 to 66 years and a Market researcher living in a residence hall, or have one of several medical conditions. You may also need additional booster doses.  Hepatitis A vaccine.** / Consult your health care provider.  Hepatitis B vaccine.** / Consult your health care provider.  Haemophilus influenzae type b (Hib) vaccine.** / Consult your health care provider. Ages 95 to 87  Blood pressure check.** / Every year.  Lipid and cholesterol check.** / Every 5 years beginning at age 56.  Lung cancer screening. / Every year if you are aged 10-80 years and have a 30-pack-year history of smoking and currently smoke or have quit within the past 15 years. Yearly screening is stopped once you have  quit smoking for at least 15 years or develop a health problem that would prevent you from having lung cancer treatment.  Fecal occult blood test (FOBT) of stool. / Every year beginning at age 15 and continuing until age 48. You may not have to do this test if you get a colonoscopy every 10 years.  Flexible sigmoidoscopy** or colonoscopy.** / Every 5 years for a flexible sigmoidoscopy or every 10 years for a colonoscopy beginning at age 46 and continuing until age 30.  Hepatitis C blood test.** / For all people born from [birth year] through 1965 and any individual with known risks for hepatitis C.  Skin self-exam. / Monthly.  Influenza vaccine. / Every year.  Tetanus, diphtheria, and acellular pertussis (Tdap/Td) vaccine.** / Consult your health care provider. 1 dose of Td every 10 years.  Varicella vaccine.** / Consult your health care provider.  Zoster vaccine.** / 1 dose for adults aged 90 years or older.  Measles, mumps, rubella (MMR) vaccine.** / You need at least 1 dose of MMR if you were born in 1957 or later. You may also need a second dose.  Pneumococcal 13-valent conjugate (PCV13) vaccine.** / Consult your health care provider.  Pneumococcal polysaccharide (PPSV23) vaccine.** / 1 to 2 doses if you smoke cigarettes or if you have certain conditions.  Meningococcal vaccine.** / Consult your health care provider.  Hepatitis A vaccine.** / Consult your health care provider.  Hepatitis B vaccine.** / Consult your health care provider.  Haemophilus influenzae type b (Hib) vaccine.** / Consult your health care provider. Ages 24 and over  Blood pressure check.** / Every year.  Lipid and cholesterol check.**/ Every 5 years beginning at age 80.  Lung cancer screening. / Every year if you are aged 50-80 years and have a 30-pack-year history of smoking and currently smoke or have quit within the past 15 years. Yearly screening is stopped once you have quit smoking for at least 15 years or  develop a health problem that would prevent you from having lung cancer treatment.  Fecal occult blood test (FOBT) of stool. / Every year beginning at age 4 and continuing until age 63. You may not have to do this test if you get a colonoscopy every 10 years.  Flexible sigmoidoscopy** or colonoscopy.** / Every 5 years for a flexible sigmoidoscopy or every 10 years for a colonoscopy beginning at age 17 and continuing until age 40.  Hepatitis C blood test.** / For all people born from [birth year] through 1965 and any individual with known risks for hepatitis C.  Abdominal aortic aneurysm (AAA) screening.** / A one-time screening for ages 89 to 46 years who are current or former smokers.  Skin self-exam. / Monthly.  Influenza vaccine. / Every year.  Tetanus, diphtheria, and acellular pertussis (Tdap/Td) vaccine.** / 1 dose of Td every 10 years.  Varicella vaccine.** / Consult your health care provider.  Zoster vaccine.** / 1 dose for adults aged 43 years or older.  Pneumococcal 13-valent conjugate (PCV13) vaccine.** / 1 dose for all adults aged 70 years and older.  Pneumococcal polysaccharide (PPSV23) vaccine.** / 1 dose for all adults aged 16 years and older.  Meningococcal vaccine.** / Consult your health care provider.  Hepatitis A vaccine.** / Consult your health care provider.  Hepatitis B vaccine.** / Consult your health care provider.  Haemophilus influenzae type b (Hib) vaccine.** / Consult your health care provider. **Family history and personal history of risk and conditions may change your health care provider's recommendations.   This information is not intended to replace advice given to you by your health care provider. Make sure you discuss  any questions you have with your health care provider.   Document Released: 04/13/2001 Document Revised: 03/08/2014 Document Reviewed: 07/13/2010 [employer]vier Interactive Patient Education [employer].

## 2015-01-15 LAB — COMPREHENSIVE METABOLIC PANEL
ALBUMIN: 4.1 g/dL (ref 3.6–4.8)
ALK PHOS: 56 IU/L (ref 39–117)
ALT: 21 IU/L (ref 0–44)
AST: 18 IU/L (ref 0–40)
Albumin/Globulin Ratio: 1.5 (ref 1.1–2.5)
BUN / CREAT RATIO: 9 — AB (ref 10–22)
BUN: 12 mg/dL (ref 8–27)
Bilirubin Total: 0.4 mg/dL (ref 0.0–1.2)
CO2: 22 mmol/L (ref 18–29)
CREATININE: 1.27 mg/dL (ref 0.76–1.27)
Calcium: 9.6 mg/dL (ref 8.6–10.2)
Chloride: 102 mmol/L (ref 97–106)
GFR calc non Af Amer: 59 mL/min/{1.73_m2} — ABNORMAL LOW (ref >59)
GFR, EST AFRICAN AMERICAN: 69 mL/min/{1.73_m2} (ref >59)
GLUCOSE: 88 mg/dL (ref 65–99)
Globulin, Total: 2.7 g/dL (ref 1.5–4.5)
Potassium: 4.4 mmol/L (ref 3.5–5.2)
Sodium: 139 mmol/L (ref 136–144)
TOTAL PROTEIN: 6.8 g/dL (ref 6.0–8.5)

## 2015-01-15 LAB — CBC WITH DIFFERENTIAL/PLATELET
BASOS: 0 %
Basophils Absolute: 0 10*3/uL (ref 0.0–0.2)
EOS (ABSOLUTE): 0.1 10*3/uL (ref 0.0–0.4)
Eos: 2 %
HEMATOCRIT: 47.8 % (ref 37.5–51.0)
HEMOGLOBIN: 16.3 g/dL (ref 12.6–17.7)
IMMATURE GRANS (ABS): 0 10*3/uL (ref 0.0–0.1)
Immature Granulocytes: 0 %
LYMPHS ABS: 2.1 10*3/uL (ref 0.7–3.1)
LYMPHS: 42 %
MCH: 30.1 pg (ref 26.6–33.0)
MCHC: 34.1 g/dL (ref 31.5–35.7)
MCV: 88 fL (ref 79–97)
MONOCYTES: 12 %
Monocytes Absolute: 0.6 10*3/uL (ref 0.1–0.9)
NEUTROS ABS: 2.1 10*3/uL (ref 1.4–7.0)
Neutrophils: 44 %
Platelets: 225 10*3/uL (ref 150–379)
RBC: 5.41 x10E6/uL (ref 4.14–5.80)
RDW: 14.2 % (ref 12.3–15.4)
WBC: 4.9 10*3/uL (ref 3.4–10.8)

## 2015-01-15 LAB — LIPID PANEL
Chol/HDL Ratio: 4.7 ratio units (ref 0.0–5.0)
Cholesterol, Total: 189 mg/dL (ref 100–199)
HDL: 40 mg/dL (ref >39)
LDL Calculated: 111 mg/dL — ABNORMAL HIGH (ref 0–99)
Triglycerides: 190 mg/dL — ABNORMAL HIGH (ref 0–149)
VLDL Cholesterol Cal: 38 mg/dL (ref 5–40)

## 2015-01-15 LAB — PSA: PROSTATE SPECIFIC AG, SERUM: 1.2 ng/mL (ref 0.0–4.0)

## 2015-03-20 ENCOUNTER — Ambulatory Visit: Payer: Managed Care, Other (non HMO) | Admitting: *Deleted

## 2015-04-03 ENCOUNTER — Encounter: Payer: Managed Care, Other (non HMO) | Admitting: Gastroenterology

## 2015-07-16 ENCOUNTER — Encounter: Payer: Self-pay | Admitting: Internal Medicine

## 2015-07-16 ENCOUNTER — Encounter: Payer: Self-pay | Admitting: Nurse Practitioner

## 2015-07-16 ENCOUNTER — Ambulatory Visit: Payer: Managed Care, Other (non HMO) | Admitting: Internal Medicine

## 2015-09-24 ENCOUNTER — Ambulatory Visit (INDEPENDENT_AMBULATORY_CARE_PROVIDER_SITE_OTHER): Payer: Managed Care, Other (non HMO) | Admitting: Physician Assistant

## 2015-09-24 ENCOUNTER — Ambulatory Visit (INDEPENDENT_AMBULATORY_CARE_PROVIDER_SITE_OTHER): Payer: Managed Care, Other (non HMO)

## 2015-09-24 DIAGNOSIS — M25512 Pain in left shoulder: Secondary | ICD-10-CM

## 2015-09-24 DIAGNOSIS — M25522 Pain in left elbow: Secondary | ICD-10-CM

## 2015-09-24 MED ORDER — CYCLOBENZAPRINE HCL 5 MG PO TABS: 5.0000 mg | ORAL_TABLET | Freq: Three times a day (TID) | ORAL | 1 refills | Status: DC | PRN

## 2015-09-24 MED ORDER — MELOXICAM 15 MG PO TABS: 15.0000 mg | ORAL_TABLET | Freq: Every day | ORAL | 0 refills | Status: DC
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
## 2016-07-15 ENCOUNTER — Encounter: Payer: Self-pay | Admitting: Nurse Practitioner

## 2016-07-15 ENCOUNTER — Ambulatory Visit (INDEPENDENT_AMBULATORY_CARE_PROVIDER_SITE_OTHER): Payer: BLUE CROSS/BLUE SHIELD | Admitting: Nurse Practitioner

## 2016-07-15 VITALS — BP 124/82 | HR 80 | Temp 98.4°F | Resp 18 | Ht 72.0 in | Wt 201.4 lb

## 2016-07-15 DIAGNOSIS — Z13220 Encounter for screening for lipoid disorders: Secondary | ICD-10-CM

## 2016-07-15 DIAGNOSIS — Z8042 Family history of malignant neoplasm of prostate: Secondary | ICD-10-CM | POA: Diagnosis not present

## 2016-07-15 DIAGNOSIS — L0292 Furuncle, unspecified: Secondary | ICD-10-CM | POA: Diagnosis not present

## 2016-07-15 NOTE — Patient Instructions (Signed)
To use dial soap twice daily and pat area dry.  May use warm compress to help Notify if area become larger, tender, draining or hot   Epidermal Cyst An epidermal cyst is sometimes called an epidermal inclusion cyst or an infundibular cyst. It is a sac made of skin tissue. The sac contains a substance called keratin. Keratin is a protein that is normally secreted through the hair follicles. When keratin becomes trapped in the top layer of skin (epidermis), it can form an epidermal cyst. Epidermal cysts are usually found on the face, neck, trunk, and genitals. These cysts are usually harmless (benign), and they may not cause symptoms unless they become infected. It is important not to pop epidermal cysts yourself. What are the causes? This condition may be caused by:  A blocked hair follicle.  A hair that curls and re-enters the skin instead of growing straight out of the skin (ingrown hair).  A blocked pore.  Irritated skin.  An injury to the skin.  Certain conditions that are passed along from parent to child (inherited).  Human papillomavirus (HPV). What increases the risk? The following factors may make you more likely to develop an epidermal cyst:  Having acne.  Being overweight.  Wearing tight clothing. What are the signs or symptoms? The only symptom of this condition may be a small, painless lump underneath the skin. When an epidermal cyst becomes infected, symptoms may include:  Redness.  Inflammation.  Tenderness.  Warmth.  Fever.  Keratin draining from the cyst. Keratin may look like a grayish-white, bad-smelling substance.  Pus draining from the cyst. How is this diagnosed? This condition is diagnosed with a physical exam. In some cases, you may have a sample of tissue (biopsy) taken from your cyst to be examined under a microscope or tested for bacteria. You may be referred to a health care provider who specializes in skin care (dermatologist). How is this  treated? In many cases, epidermal cysts go away on their own without treatment. If a cyst becomes infected, treatment may include:  Opening and draining the cyst. After draining, minor surgery to remove the rest of the cyst may be done.  Antibiotic medicine to help prevent infection.  Injections of medicines (steroids) that help to reduce inflammation.  Surgery to remove the cyst. Surgery may be done if:  The cyst becomes large.  The cyst bothers you.  There is a chance that the cyst could turn into cancer. Follow these instructions at home:  Take over-the-counter and prescription medicines only as told by your health care provider.  If you were prescribed an antibiotic, use it as told by your health care provider. Do not stop using the antibiotic even if you start to feel better.  Keep the area around your cyst clean and dry.  Wear loose, dry clothing.  Do not try to pop your cyst.  Avoid touching your cyst.  Check your cyst every day for signs of infection.  Keep all follow-up visits as told by your health care provider. This is important. How is this prevented?  Wear clean, dry, clothing.  Avoid wearing tight clothing.  Keep your skin clean and dry. Shower or take baths every day.  Wash your body with a benzoyl peroxide wash when you shower or bathe. Contact a health care provider if:  Your cyst develops symptoms of infection.  Your condition is not improving or is getting worse.  You develop a cyst that looks different from other cysts you have  had.  You have a fever. Get help right away if:  Redness spreads from the cyst into the surrounding area. This information is not intended to replace advice given to you by your health care provider. Make sure you discuss any questions you have with your health care provider. Document Released: 01/17/2004 Document Revised: 10/15/2015 Document Reviewed: 12/18/2014 Elsevier Interactive Patient Education  2017 [employer]

## 2016-07-15 NOTE — Progress Notes (Signed)
Careteam: Patient Care Team: Sharon Seller, NP as PCP - General (Geriatric Medicine)  Advanced Directive information Does Patient Have a Medical Advance Directive?: No  No Known Allergies  Chief Complaint  Patient presents with  . Acute Visit    Pt is being seen for multiple knots in right axillary x 1 month.     HPI: Patient is a 66 y.o. male seen in the office today due to knots under his arm. Started 1 month ago and noticed it did not go down so made appt. 3 knots last week now they are much smaller and not painful  Reports his friend told him it was a boil. Stated the areas became quite large and painful. One opened up like an ulcer and began to ooze. Also taking baths which have helped it.  No fevers or chills.   Review of Systems:  Review of Systems  Constitutional: Negative for chills, fever and malaise/fatigue.  Respiratory: Negative.   Cardiovascular: Negative.   Musculoskeletal: Negative.   Skin: Negative for itching and rash.  Neurological: Negative for weakness.    History reviewed. No pertinent past medical history. History reviewed. No pertinent surgical history. Social History:   reports that he has never smoked. He has never used smokeless tobacco. He reports that he does not drink alcohol or use drugs.  History reviewed. No pertinent family history.  Medications: Patient's Medications  New Prescriptions   No medications on file  Previous Medications   No medications on file  Modified Medications   No medications on file  Discontinued Medications   CYCLOBENZAPRINE (FLEXERIL) 5 MG TABLET    Take 1 tablet (5 mg total) by mouth 3 (three) times daily as needed for muscle spasms.   MELOXICAM (MOBIC) 15 MG TABLET    Take 1 tablet (15 mg total) by mouth daily.     Physical Exam:  Vitals:   07/15/16 1558  BP: 124/82  Pulse: 80  Resp: 18  Temp: 98.4 F (36.9 C)  TempSrc: Oral  SpO2: 97%  Weight: 201 lb 6.4 oz (91.4 kg)  Height: 6'  (1.829 m)   Body mass index is 27.31 kg/m.  Physical Exam  Constitutional: He is oriented to person, place, and time. He appears well-developed and well-nourished. No distress.  HENT:  Head: Normocephalic and atraumatic.  Mouth/Throat: Oropharynx is clear and moist. No oropharyngeal exudate.  Neck: Normal range of motion. Neck supple.  Cardiovascular: Normal rate, regular rhythm and normal heart sounds.   Pulmonary/Chest: Effort normal and breath sounds normal.  Musculoskeletal: He exhibits no edema or tenderness.  Neurological: He is alert and oriented to person, place, and time.  Skin: Skin is warm and dry. No rash noted. He is not diaphoretic. No erythema.  Small cyst noted under axilla around hair follicles   Psychiatric: He has a normal mood and affect.    Labs reviewed: Basic Metabolic Panel: No results for input(s): NA, K, CL, CO2, GLUCOSE, BUN, CREATININE, CALCIUM, MG, PHOS, TSH in the last 8760 hours. Liver Function Tests: No results for input(s): AST, ALT, ALKPHOS, BILITOT, PROT, ALBUMIN in the last 8760 hours. No results for input(s): LIPASE, AMYLASE in the last 8760 hours. No results for input(s): AMMONIA in the last 8760 hours. CBC: No results for input(s): WBC, NEUTROABS, HGB, HCT, MCV, PLT in the last 8760 hours. Lipid Panel: No results for input(s): CHOL, HDL, LDLCALC, TRIG, CHOLHDL, LDLDIRECT in the last 8760 hours. TSH: No results for input(s): TSH in the  last 8760 hours. A1C: No results found for: HGBA1C   Assessment/Plan 1. Furunculosis of skin -improved, no signs of infection.  -to use dial soap twice daily -cont to use heat   2. Family hx of prostate cancer Father recently died of prostate cancer - PSA; Future  3. Need for lipid screening - Lipid Panel; Future   To follow up in 1 week for EV with fasting blood work prior to visit.   Janene HarveyJessica K. Biagio BorgEubanks, AGNP  [facility] [phone](Monday-Friday 8 am - 5  pm) [phone] (after hours)

## 2016-07-20 ENCOUNTER — Encounter: Payer: Self-pay | Admitting: Nurse Practitioner

## 2016-07-27 ENCOUNTER — Other Ambulatory Visit: Payer: BLUE CROSS/BLUE SHIELD

## 2016-07-27 DIAGNOSIS — L0292 Furuncle, unspecified: Secondary | ICD-10-CM

## 2016-07-27 DIAGNOSIS — Z13220 Encounter for screening for lipoid disorders: Secondary | ICD-10-CM | POA: Diagnosis not present

## 2016-07-27 DIAGNOSIS — Z8042 Family history of malignant neoplasm of prostate: Secondary | ICD-10-CM

## 2016-07-27 LAB — COMPLETE METABOLIC PANEL WITH GFR
ALBUMIN: 3.9 g/dL (ref 3.6–5.1)
ALK PHOS: 42 U/L (ref 40–115)
ALT: 16 U/L (ref 9–46)
AST: 12 U/L (ref 10–35)
BILIRUBIN TOTAL: 0.7 mg/dL (ref 0.2–1.2)
BUN: 17 mg/dL (ref 7–25)
CALCIUM: 9 mg/dL (ref 8.6–10.3)
CO2: 23 mmol/L (ref 20–31)
Chloride: 107 mmol/L (ref 98–110)
Creat: 1.34 mg/dL — ABNORMAL HIGH (ref 0.70–1.25)
GFR, EST AFRICAN AMERICAN: 64 mL/min (ref >=60)
GFR, Est Non African American: 55 mL/min — ABNORMAL LOW (ref >=60)
Glucose, Bld: 100 mg/dL — ABNORMAL HIGH (ref 65–99)
POTASSIUM: 4.4 mmol/L (ref 3.5–5.3)
Sodium: 140 mmol/L (ref 135–146)
TOTAL PROTEIN: 6.8 g/dL (ref 6.1–8.1)

## 2016-07-27 LAB — LIPID PANEL
CHOLESTEROL: 204 mg/dL — AB (ref <200)
HDL: 42 mg/dL (ref >40)
LDL CALC: 136 mg/dL — AB (ref <100)
TRIGLYCERIDES: 130 mg/dL (ref <150)
Total CHOL/HDL Ratio: 4.9 Ratio (ref <5.0)
VLDL: 26 mg/dL (ref <30)

## 2016-07-27 LAB — CBC WITH DIFFERENTIAL/PLATELET
BASOS ABS: 44 {cells}/uL (ref 0–200)
Basophils Relative: 1 %
EOS ABS: 88 {cells}/uL (ref 15–500)
Eosinophils Relative: 2 %
HCT: 47.6 % (ref 38.5–50.0)
HEMOGLOBIN: 16 g/dL (ref 13.2–17.1)
LYMPHS ABS: 1760 {cells}/uL (ref 850–3900)
Lymphocytes Relative: 40 %
MCH: 29.8 pg (ref 27.0–33.0)
MCHC: 33.6 g/dL (ref 32.0–36.0)
MCV: 88.6 fL (ref 80.0–100.0)
MONOS PCT: 14 %
MPV: 9.5 fL (ref 7.5–12.5)
Monocytes Absolute: 616 cells/uL (ref 200–950)
NEUTROS PCT: 43 %
Neutro Abs: 1892 cells/uL (ref 1500–7800)
Platelets: 205 10*3/uL (ref 140–400)
RBC: 5.37 MIL/uL (ref 4.20–5.80)
RDW: 14.9 % (ref 11.0–15.0)
WBC: 4.4 10*3/uL (ref 3.8–10.8)

## 2016-07-28 LAB — PSA: PSA: 0.9 ng/mL (ref <=4.0)

## 2016-07-29 ENCOUNTER — Ambulatory Visit (INDEPENDENT_AMBULATORY_CARE_PROVIDER_SITE_OTHER): Payer: BLUE CROSS/BLUE SHIELD | Admitting: Nurse Practitioner

## 2016-07-29 ENCOUNTER — Encounter: Payer: Self-pay | Admitting: Nurse Practitioner

## 2016-07-29 VITALS — BP 132/84 | HR 82 | Temp 98.3°F | Resp 17 | Ht 72.0 in | Wt 200.8 lb

## 2016-07-29 DIAGNOSIS — Z0000 Encounter for general adult medical examination without abnormal findings: Secondary | ICD-10-CM | POA: Diagnosis not present

## 2016-07-29 DIAGNOSIS — H6123 Impacted cerumen, bilateral: Secondary | ICD-10-CM

## 2016-07-29 DIAGNOSIS — Z1211 Encounter for screening for malignant neoplasm of colon: Secondary | ICD-10-CM | POA: Diagnosis not present

## 2016-07-29 DIAGNOSIS — Z23 Encounter for immunization: Secondary | ICD-10-CM

## 2016-07-29 DIAGNOSIS — Z8042 Family history of malignant neoplasm of prostate: Secondary | ICD-10-CM | POA: Diagnosis not present

## 2016-07-29 DIAGNOSIS — E782 Mixed hyperlipidemia: Secondary | ICD-10-CM

## 2016-07-29 MED ORDER — ZOSTER VAC RECOMB ADJUVANTED 50 MCG/0.5ML IM SUSR: 0.5000 mL | Freq: Once | INTRAMUSCULAR | 1 refills | Status: AC

## 2016-07-29 NOTE — Patient Instructions (Addendum)
To make appt with dentist  To get shingles vaccine at [facility]   DASH Eating Plan DASH stands for "Dietary Approaches to Stop Hypertension." The DASH eating plan is a healthy eating plan that has been shown to reduce high blood pressure (hypertension). It may also reduce your risk for type 2 diabetes, heart disease, and stroke. The DASH eating plan may also help with weight loss. What are tips for following this plan? General guidelines  Avoid eating more than 2,300 mg (milligrams) of salt (sodium) a day. If you have hypertension, you may need to reduce your sodium intake to 1,500 mg a day.  Limit alcohol intake to no more than 1 drink a day for nonpregnant women and 2 drinks a day for men. One drink equals 12 oz of beer, 5 oz of wine, or 1 oz of hard liquor.  Work with your health care provider to maintain a healthy body weight or to lose weight. Ask what an ideal weight is for you.  Get at least 30 minutes of exercise that causes your heart to beat faster (aerobic exercise) most days of the week. Activities may include walking, swimming, or biking.  Work with your health care provider or diet and nutrition specialist (dietitian) to adjust your eating plan to your individual calorie needs. Reading food labels  Check food labels for the amount of sodium per serving. Choose foods with less than 5 percent of the Daily Value of sodium. Generally, foods with less than 300 mg of sodium per serving fit into this eating plan.  To find whole grains, look for the word "whole" as the first word in the ingredient list. Shopping  Buy products labeled as "low-sodium" or "no salt added."  Buy fresh foods. Avoid canned foods and premade or frozen meals. Cooking  Avoid adding salt when cooking. Use salt-free seasonings or herbs instead of table salt or sea salt. Check with your health care provider or pharmacist before using salt substitutes.  Do not fry foods. Cook foods using healthy methods  such as baking, boiling, grilling, and broiling instead.  Cook with heart-healthy oils, such as olive, canola, soybean, or sunflower oil. Meal planning   Eat a balanced diet that includes: ? 5 or more servings of fruits and vegetables each day. At each meal, try to fill half of your plate with fruits and vegetables. ? Up to 6-8 servings of whole grains each day. ? Less than 6 oz of lean meat, poultry, or fish each day. A 3-oz serving of meat is about the same size as a deck of cards. One egg equals 1 oz. ? 2 servings of low-fat dairy each day. ? A serving of nuts, seeds, or beans 5 times each week. ? Heart-healthy fats. Healthy fats called Omega-3 fatty acids are found in foods such as flaxseeds and coldwater fish, like sardines, salmon, and mackerel.  Limit how much you eat of the following: ? Canned or prepackaged foods. ? Food that is high in trans fat, such as fried foods. ? Food that is high in saturated fat, such as fatty meat. ? Sweets, desserts, sugary drinks, and other foods with added sugar. ? Full-fat dairy products.  Do not salt foods before eating.  Try to eat at least 2 vegetarian meals each week.  Eat more home-cooked food and less restaurant, buffet, and fast food.  When eating at a restaurant, ask that your food be prepared with less salt or no salt, if possible. What foods are recommended?  The items listed may not be a complete list. Talk with your dietitian about what dietary choices are best for you. Grains Whole-grain or whole-wheat bread. Whole-grain or whole-wheat pasta. Brown rice. [place]row. Bulgur. Whole-grain and low-sodium cereals. Pita bread. Low-fat, low-sodium crackers. Whole-wheat flour tortillas. Vegetables Fresh or frozen vegetables (raw, steamed, roasted, or grilled). Low-sodium or reduced-sodium tomato and vegetable juice. Low-sodium or reduced-sodium tomato sauce and tomato paste. Low-sodium or reduced-sodium canned vegetables. Fruits All  fresh, dried, or frozen fruit. Canned fruit in natural juice (without added sugar). Meat and other protein foods Skinless chicken or [place]. Ground chicken or [place]. Pork with fat trimmed off. Fish and seafood. Egg whites. Dried beans, peas, or lentils. Unsalted nuts, nut butters, and seeds. Unsalted canned beans. Lean cuts of beef with fat trimmed off. Low-sodium, lean deli meat. Dairy Low-fat (1%) or fat-free (skim) milk. Fat-free, low-fat, or reduced-fat cheeses. Nonfat, low-sodium ricotta or cottage cheese. Low-fat or nonfat yogurt. Low-fat, low-sodium cheese. Fats and oils Soft margarine without trans fats. Vegetable oil. Low-fat, reduced-fat, or light mayonnaise and salad dressings (reduced-sodium). Canola, safflower, olive, soybean, and sunflower oils. Avocado. Seasoning and other foods Herbs. Spices. Seasoning mixes without salt. Unsalted popcorn and pretzels. Fat-free sweets. What foods are not recommended? The items listed may not be a complete list. Talk with your dietitian about what dietary choices are best for you. Grains Baked goods made with fat, such as croissants, muffins, or some breads. Dry pasta or rice meal packs. Vegetables Creamed or fried vegetables. Vegetables in a cheese sauce. Regular canned vegetables (not low-sodium or reduced-sodium). Regular canned tomato sauce and paste (not low-sodium or reduced-sodium). Regular tomato and vegetable juice (not low-sodium or reduced-sodium). Angie Fava. Olives. Fruits Canned fruit in a light or heavy syrup. Fried fruit. Fruit in cream or butter sauce. Meat and other protein foods Fatty cuts of meat. Ribs. Fried meat. Berniece Salines. Sausage. Bologna and other processed lunch meats. Salami. Fatback. Hotdogs. Bratwurst. Salted nuts and seeds. Canned beans with added salt. Canned or smoked fish. Whole eggs or egg yolks. Chicken or [place] with skin. Dairy Whole or 2% milk, cream, and half-and-half. Whole or full-fat cream cheese. Whole-fat or  sweetened yogurt. Full-fat cheese. Nondairy creamers. Whipped toppings. Processed cheese and cheese spreads. Fats and oils Butter. Stick margarine. Lard. Shortening. Ghee. Bacon fat. Tropical oils, such as coconut, palm kernel, or palm oil. Seasoning and other foods Salted popcorn and pretzels. Onion salt, garlic salt, seasoned salt, table salt, and sea salt. Worcestershire sauce. Tartar sauce. Barbecue sauce. Teriyaki sauce. Soy sauce, including reduced-sodium. Steak sauce. Canned and packaged gravies. Fish sauce. Oyster sauce. Cocktail sauce. Horseradish that you find on the shelf. Ketchup. Mustard. Meat flavorings and tenderizers. Bouillon cubes. Hot sauce and Tabasco sauce. Premade or packaged marinades. Premade or packaged taco seasonings. Relishes. Regular salad dressings. Where to find more information:  National Heart, Lung, and [place]: https://wilson-eaton.com/  American Heart Association: www.heart.org Summary  The DASH eating plan is a healthy eating plan that has been shown to reduce high blood pressure (hypertension). It may also reduce your risk for type 2 diabetes, heart disease, and stroke.  With the DASH eating plan, you should limit salt (sodium) intake to 2,300 mg a day. If you have hypertension, you may need to reduce your sodium intake to 1,500 mg a day.  When on the DASH eating plan, aim to eat more fresh fruits and vegetables, whole grains, lean proteins, low-fat dairy, and heart-healthy fats.  Work with your health care  provider or diet and nutrition specialist (dietitian) to adjust your eating plan to your individual calorie needs. This information is not intended to replace advice given to you by your health care provider. Make sure you discuss any questions you have with your health care provider. Document Released: 02/04/2011 Document Revised: 02/09/2016 Document Reviewed: 02/09/2016 Elsevier Interactive Patient Education  2017 Elsevier Inc.   Health Maintenance,  Male A healthy lifestyle and preventive care is important for your health and wellness. Ask your health care provider about what schedule of regular examinations is right for you. What should I know about weight and diet? Eat a Healthy Diet  Eat plenty of vegetables, fruits, whole grains, low-fat dairy products, and lean protein.  Do not eat a lot of foods high in solid fats, added sugars, or salt.  Maintain a Healthy Weight Regular exercise can help you achieve or maintain a healthy weight. You should:  Do at least 150 minutes of exercise each week. The exercise should increase your heart rate and make you sweat (moderate-intensity exercise).  Do strength-training exercises at least twice a week.  Watch Your Levels of Cholesterol and Blood Lipids  Have your blood tested for lipids and cholesterol every 5 years starting at 66 years of age. If you are at high risk for heart disease, you should start having your blood tested when you are 66 years old. You may need to have your cholesterol levels checked more often if: ? Your lipid or cholesterol levels are high. ? You are older than 66 years of age. ? You are at high risk for heart disease.  What should I know about cancer screening? Many types of cancers can be detected early and may often be prevented. Lung Cancer  You should be screened every year for lung cancer if: ? You are a current smoker who has smoked for at least 30 years. ? You are a former smoker who has quit within the past 15 years.  Talk to your health care provider about your screening options, when you should start screening, and how often you should be screened.  Colorectal Cancer  Routine colorectal cancer screening usually begins at 66 years of age and should be repeated every 5-10 years until you are 66 years old. You may need to be screened more often if early forms of precancerous polyps or small growths are found. Your health care provider may recommend  screening at an earlier age if you have risk factors for colon cancer.  Your health care provider may recommend using home test kits to check for hidden blood in the stool.  A small camera at the end of a tube can be used to examine your colon (sigmoidoscopy or colonoscopy). This checks for the earliest forms of colorectal cancer.  Prostate and Testicular Cancer  Depending on your age and overall health, your health care provider may do certain tests to screen for prostate and testicular cancer.  Talk to your health care provider about any symptoms or concerns you have about testicular or prostate cancer.  Skin Cancer  Check your skin from head to toe regularly.  Tell your health care provider about any new moles or changes in moles, especially if: ? There is a change in a mole's size, shape, or color. ? You have a mole that is larger than a pencil eraser.  Always use sunscreen. Apply sunscreen liberally and repeat throughout the day.  Protect yourself by wearing long sleeves, pants, a wide-brimmed hat, and sunglasses  when outside.  What should I know about heart disease, diabetes, and high blood pressure?  If you are 1918-66 years of age, have your blood pressure checked every 3-5 years. If you are 66 years of age or older, have your blood pressure checked every year. You should have your blood pressure measured twice-once when you are at a hospital or clinic, and once when you are not at a hospital or clinic. Record the average of the two measurements. To check your blood pressure when you are not at a hospital or clinic, you can use: ? An automated blood pressure machine at a pharmacy. ? A home blood pressure monitor.  Talk to your health care provider about your target blood pressure.  If you are between 445-66 years old, ask your health care provider if you should take aspirin to prevent heart disease.  Have regular diabetes screenings by checking your fasting blood sugar  level. ? If you are at a normal weight and have a low risk for diabetes, have this test once every three years after the age of 66. ? If you are overweight and have a high risk for diabetes, consider being tested at a younger age or more often.  A one-time screening for abdominal aortic aneurysm (AAA) by ultrasound is recommended for men aged 65-75 years who are current or former smokers. What should I know about preventing infection? Hepatitis B If you have a higher risk for hepatitis B, you should be screened for this virus. Talk with your health care provider to find out if you are at risk for hepatitis B infection. Hepatitis C Blood testing is recommended for:  Everyone born from [id number] through 1965.  Anyone with known risk factors for hepatitis C.  Sexually Transmitted Diseases (STDs)  You should be screened each year for STDs including gonorrhea and chlamydia if: ? You are sexually active and are younger than 66 years of age. ? You are older than 66 years of age and your health care provider tells you that you are at risk for this type of infection. ? Your sexual activity has changed since you were last screened and you are at an increased risk for chlamydia or gonorrhea. Ask your health care provider if you are at risk.  Talk with your health care provider about whether you are at high risk of being infected with HIV. Your health care provider may recommend a prescription medicine to help prevent HIV infection.  What else can I do?  Schedule regular health, dental, and eye exams.  Stay current with your vaccines (immunizations).  Do not use any tobacco products, such as cigarettes, chewing tobacco, and e-cigarettes. If you need help quitting, ask your health care provider.  Limit alcohol intake to no more than 2 drinks per day. One drink equals 12 ounces of beer, 5 ounces of wine, or 1 ounces of hard liquor.  Do not use street drugs.  Do not share needles.  Ask your health  care provider for help if you need support or information about quitting drugs.  Tell your health care provider if you often feel depressed.  Tell your health care provider if you have ever been abused or do not feel safe at home. This information is not intended to replace advice given to you by your health care provider. Make sure you discuss any questions you have with your health care provider. Document Released: 08/14/2007 Document Revised: 10/15/2015 Document Reviewed: 11/19/2014 Elsevier Interactive Patient Education  2018 Elsevier  Inc.  

## 2016-07-29 NOTE — Progress Notes (Signed)
Provider: Sharon Seller, NP  Patient Care Team: Sharon Seller, NP as PCP - General (Geriatric Medicine)  Extended Emergency Contact Information Primary Emergency Contact: [facility] Address: [address]          [place] [address] [place] Home Phone: [phone] Relation: Son Secondary Emergency Contact: Artemio Aly Address: [address]          [place], [place] [address] [place] Home Phone: [phone] Relation: Brother No Known Allergies Code Status: FULL Goals of Care: Advanced Directive information Advanced Directives 07/29/2016  Does Patient Have a Medical Advance Directive? No  Would patient like information on creating a medical advance directive? Yes (MAU/Ambulatory/Procedural Areas - Information given)     Chief Complaint  Patient presents with  . Medical Management of Chronic Issues    Pt is being seen for a complete physical and follow up on chronic conditions.. Passed clock drawing.    HPI: Patient is a 66 y.o. male seen in today for an annual wellness exam.   Major illnesses or hospitalization in the last year: none  Depression screen [facility] 2/9 07/29/2016 09/24/2015 01/14/2015  Decreased Interest 0 0 0  Down, Depressed, Hopeless 0 0 0  PHQ - 2 Score 0 0 0    Fall Risk  07/29/2016 07/15/2016 09/24/2015 01/14/2015  Falls in the past year? No No No No   MMSE - Mini Mental State Exam 07/29/2016  Orientation to time 5  Orientation to Place 5  Registration 3  Attention/ Calculation 5  Recall 3  Language- name 2 objects 2  Language- repeat 1  Language- follow 3 step command 3  Language- read & follow direction 1  Write a sentence 1  Copy design 1  Total score 30     Health Maintenance  Topic Date Due  . Hepatitis C Screening  04-14-1950  . HIV Screening  11/12/1965  . COLONOSCOPY  11/12/2000  . PNA vac Low Risk Adult (1 of 2 - PCV13) 11/13/2015  . TETANUS/TDAP  06/29/2016  . INFLUENZA VACCINE   09/29/2016    Urinary incontinence? none Functional Status Survey: Is the patient deaf or have difficulty hearing?: No Does the patient have difficulty seeing, even when wearing glasses/contacts?: No Does the patient have difficulty concentrating, remembering, or making decisions?: No Does the patient have difficulty walking or climbing stairs?: No Does the patient have difficulty dressing or bathing?: No Does the patient have difficulty doing errands alone such as visiting a doctor's office or shopping?: No Current Exercise Habits: Home exercise routine, Type of exercise: calisthenics;strength training/weights;walking, Time (Minutes): 10, Frequency (Times/Week): 5, Weekly Exercise (Minutes/Week): 50   Diet?none  Hearing Screening   125Hz  250Hz  500Hz  1000Hz  2000Hz  3000Hz  4000Hz  6000Hz  8000Hz   Right ear:   40 40 40      Left ear:   40 40 40        Visual Acuity Screening   Right eye Left eye Both eyes  Without correction: 20/20 20/20 20/20   With correction:        Dentition:has not been in a few years.   Pain: none  History reviewed. No pertinent past medical history.  History reviewed. No pertinent surgical history.  Social History   Social History  . Marital status: Single    Spouse name: N/A  . Number of children: N/A  . Years of education: N/A   Social History Main Topics  . Smoking status: Never Smoker  . Smokeless tobacco: Never Used  .  Alcohol use No  . Drug use: No  . Sexual activity: Yes   Other Topics Concern  . None   Social History Narrative   Diet:      Do you drink/ eat things with caffeine? No       Marital status:    No                           What year were you married ?      Do you live in a house, apartment,assistred living, condo, trailer, etc.)? House      Is it one or more stories? 2      How many persons live in your home ? 1      Do you have any pets in your home ?(please list) No      Current or past profession: MidwifeBus Driver         Do you exercise?  At Times                            Type & how often: Run-Run      Do you have a living will? No      Do you have a DNR form? No                      If not, do you want to discuss one? Yes      Do you have signed POA?HPOA forms?  No               If so, please bring to your        appointment          History reviewed. No pertinent family history.  Review of Systems:  Review of Systems  Constitutional: Negative for activity change, appetite change, fatigue and unexpected weight change.  HENT: Negative for congestion and hearing loss.        Ears feel full, no pain   Eyes: Negative.   Respiratory: Negative for cough and shortness of breath.   Cardiovascular: Negative for chest pain, palpitations and leg swelling.  Gastrointestinal: Negative for abdominal pain, constipation and diarrhea.  Genitourinary: Negative for difficulty urinating and dysuria.       Gets up 0-2 times at night to urinate   Musculoskeletal: Negative for arthralgias and myalgias.  Skin: Negative for color change and wound.  Neurological: Negative for dizziness and weakness.  Psychiatric/Behavioral: Negative for agitation, behavioral problems and confusion.    Allergies as of 07/29/2016   No Known Allergies     Medication List       Accurate as of 07/29/16  3:20 PM. Always use your most recent med list.          Zoster Vac Recomb Adjuvanted injection Commonly known as:  SHINGRIX Inject 0.5 mLs into the muscle once.         Physical Exam: Vitals:   07/29/16 1507  BP: 132/84  Pulse: 82  Resp: 17  Temp: 98.3 F (36.8 C)  TempSrc: Oral  SpO2: 96%  Weight: 200 lb 12.8 oz (91.1 kg)  Height: 6' (1.829 m)   Body mass index is 27.23 kg/m. Physical Exam  Constitutional: He is oriented to person, place, and time. He appears well-developed and well-nourished. No distress.  HENT:  Head: Normocephalic and atraumatic.  Mouth/Throat: Oropharynx is clear and moist. No  oropharyngeal exudate.  Cerumen impaction to bilateral ears  Eyes: Conjunctivae and EOM are normal. Pupils are equal, round, and reactive to light.  Neck: Normal range of motion. Neck supple.  Cardiovascular: Normal rate, regular rhythm and normal heart sounds.   Pulmonary/Chest: Effort normal and breath sounds normal.  Abdominal: Soft. Bowel sounds are normal.  Musculoskeletal: He exhibits no edema or tenderness.  Neurological: He is alert and oriented to person, place, and time.  Skin: Skin is warm and dry. He is not diaphoretic.  Psychiatric: He has a normal mood and affect.    Labs reviewed: Basic Metabolic Panel:  Recent Labs  16/10/96 0818  NA 140  K 4.4  CL 107  CO2 23  GLUCOSE 100*  BUN 17  CREATININE 1.34*  CALCIUM 9.0   Liver Function Tests:  Recent Labs  07/27/16 0818  AST 12  ALT 16  ALKPHOS 42  BILITOT 0.7  PROT 6.8  ALBUMIN 3.9   No results for input(s): LIPASE, AMYLASE in the last 8760 hours. No results for input(s): AMMONIA in the last 8760 hours. CBC:  Recent Labs  07/27/16 0818  WBC 4.4  NEUTROABS 1,892  HGB 16.0  HCT 47.6  MCV 88.6  PLT 205   Lipid Panel:  Recent Labs  07/27/16 0818  CHOL 204*  HDL 42  LDLCALC 136*  TRIG 130  CHOLHDL 4.9   No results found for: HGBA1C  Procedures: No results found.  Assessment/Plan 1. Medicare annual wellness visit, initial Pt doing well.   The patient was counseled regarding the appropriate use of alcohol, prevention of dental and periodontal disease- needs to make dental visit, diet, regular sustained exercise for at least 30 minutes 5 times per week,  tobacco use,  and recommended schedule for GI hemoccult testing, colonoscopy, cholesterol, thyroid and diabetes screening. -MMSE 30/30 -depression screening negative.  -discussed seeing ophthalmologist and dentist -shingrix rx given   2. Family hx of prostate cancer PSA in normal range  3. Mixed hyperlipidemia LDL not at goal,  Discussed dietary changes with pt and handouts given. To increase physical activity as well.   4. Special screening for malignant neoplasms, colon -cologuard ordered  5. Bilateral impacted cerumen Unable to remove via flush and pt was tender to left ear therefore instructed to use debrox for 4 days and then return to office for lavage.   To follow up in 6 months with fasting blood work prior to visit.  Janene Harvey. Biagio Borg  [facility] [phone] 8 am - 5 pm) [phone] (after hours)

## 2016-08-19 DIAGNOSIS — H6123 Impacted cerumen, bilateral: Secondary | ICD-10-CM | POA: Diagnosis not present

## 2016-08-20 DIAGNOSIS — T63421A Toxic effect of venom of ants, accidental (unintentional), initial encounter: Secondary | ICD-10-CM | POA: Diagnosis not present

## 2016-08-20 DIAGNOSIS — M79631 Pain in right forearm: Secondary | ICD-10-CM | POA: Diagnosis not present

## 2016-08-23 DIAGNOSIS — Z1211 Encounter for screening for malignant neoplasm of colon: Secondary | ICD-10-CM | POA: Diagnosis not present

## 2016-08-23 DIAGNOSIS — Z1212 Encounter for screening for malignant neoplasm of rectum: Secondary | ICD-10-CM | POA: Diagnosis not present

## 2016-08-24 LAB — COLOGUARD: Cologuard: NEGATIVE

## 2016-08-31 ENCOUNTER — Telehealth: Payer: Self-pay

## 2016-08-31 ENCOUNTER — Other Ambulatory Visit: Payer: Self-pay

## 2016-08-31 NOTE — Telephone Encounter (Signed)
Left message for patient to let him know that the cologuard was negative, if any questions to call back.

## 2016-12-23 ENCOUNTER — Telehealth: Payer: Self-pay

## 2016-12-23 NOTE — Telephone Encounter (Signed)
[facility] and returned before 12/29/16.   Left a message asking patient to call the office to answer a few questions on the forms.

## 2016-12-24 NOTE — Telephone Encounter (Signed)
I called patient to ask that he come in to the office to sign the [facility] health screening form. It must be signed by him before it can be faxed.    Left a message explain the above to patient.

## 2016-12-24 NOTE — Telephone Encounter (Signed)
I attempted to contact patient again but I had to leave another message.   Form is in the yellow folder in my mailbox.

## 2016-12-27 NOTE — Telephone Encounter (Signed)
Patient stopped by the office to sign the health screening form. I then faxed it, made a copy, and gave original back to patient.

## 2016-12-27 NOTE — Telephone Encounter (Signed)
Left a message for patient to call or come by the office to discuss signing his health screening form so that it can be faxed by 12/29/16.

## 2017-01-31 ENCOUNTER — Other Ambulatory Visit: Payer: BLUE CROSS/BLUE SHIELD

## 2017-02-03 ENCOUNTER — Encounter: Payer: Self-pay | Admitting: Nurse Practitioner

## 2017-02-03 ENCOUNTER — Ambulatory Visit (INDEPENDENT_AMBULATORY_CARE_PROVIDER_SITE_OTHER): Payer: BLUE CROSS/BLUE SHIELD | Admitting: Nurse Practitioner

## 2017-02-03 VITALS — BP 142/84 | HR 82 | Temp 98.4°F | Resp 18 | Ht 72.0 in | Wt 202.0 lb

## 2017-02-03 DIAGNOSIS — I10 Essential (primary) hypertension: Secondary | ICD-10-CM

## 2017-02-03 DIAGNOSIS — K219 Gastro-esophageal reflux disease without esophagitis: Secondary | ICD-10-CM

## 2017-02-03 DIAGNOSIS — E782 Mixed hyperlipidemia: Secondary | ICD-10-CM | POA: Diagnosis not present

## 2017-02-03 DIAGNOSIS — Z23 Encounter for immunization: Secondary | ICD-10-CM | POA: Diagnosis not present

## 2017-02-03 LAB — COMPLETE METABOLIC PANEL WITH GFR
AG Ratio: 1.4 (calc) (ref 1.0–2.5)
ALKALINE PHOSPHATASE (APISO): 41 U/L (ref 40–115)
ALT: 19 U/L (ref 9–46)
AST: 11 U/L (ref 10–35)
Albumin: 4 g/dL (ref 3.6–5.1)
BUN: 15 mg/dL (ref 7–25)
CALCIUM: 9.5 mg/dL (ref 8.6–10.3)
CO2: 27 mmol/L (ref 20–32)
CREATININE: 1.08 mg/dL (ref 0.70–1.25)
Chloride: 103 mmol/L (ref 98–110)
GFR, EST NON AFRICAN AMERICAN: 71 mL/min/{1.73_m2} (ref >=60)
GFR, Est African American: 82 mL/min/{1.73_m2} (ref >=60)
GLOBULIN: 2.9 g/dL (ref 1.9–3.7)
GLUCOSE: 85 mg/dL (ref 65–99)
Potassium: 4.1 mmol/L (ref 3.5–5.3)
SODIUM: 137 mmol/L (ref 135–146)
Total Bilirubin: 1.1 mg/dL (ref 0.2–1.2)
Total Protein: 6.9 g/dL (ref 6.1–8.1)

## 2017-02-03 LAB — LIPID PANEL
CHOL/HDL RATIO: 4.3 (calc) (ref <5.0)
Cholesterol: 204 mg/dL — ABNORMAL HIGH (ref <200)
HDL: 47 mg/dL (ref >40)
LDL CHOLESTEROL (CALC): 135 mg/dL — AB
NON-HDL CHOLESTEROL (CALC): 157 mg/dL — AB (ref <130)
Triglycerides: 117 mg/dL (ref <150)

## 2017-02-03 NOTE — Patient Instructions (Signed)
Heartburn Heartburn is a type of pain or discomfort that can happen in the throat or chest. It is often described as a burning pain. It may also cause a bad taste in the mouth. Heartburn may feel worse when you lie down or bend over. It may be caused by stomach contents that move back up (reflux) into the tube that connects the mouth with the stomach (esophagus). Follow these instructions at home: Take these actions to lessen your discomfort and to help avoid problems. Diet  Follow a diet as told by your doctor. You may need to avoid foods and drinks such as: ? Coffee and tea (with or without caffeine). ? Drinks that contain alcohol. ? Energy drinks and sports drinks. ? Carbonated drinks or sodas. ? Chocolate and cocoa. ? Peppermint and mint flavorings. ? Garlic and onions. ? Horseradish. ? Spicy and acidic foods, such as peppers, chili powder, curry powder, vinegar, hot sauces, and BBQ sauce. ? Citrus fruit juices and citrus fruits, such as oranges, lemons, and limes. ? Tomato-based foods, such as red sauce, chili, salsa, and pizza with red sauce. ? Fried and fatty foods, such as donuts, french fries, potato chips, and high-fat dressings. ? High-fat meats, such as hot dogs, rib eye steak, sausage, ham, and bacon. ? High-fat dairy items, such as whole milk, butter, and cream cheese.  Eat small meals often. Avoid eating large meals.  Avoid drinking large amounts of liquid with your meals.  Avoid eating meals during the 2-3 hours before bedtime.  Avoid lying down right after you eat.  Do not exercise right after you eat. General instructions  Pay attention to any changes in your symptoms.  Take over-the-counter and prescription medicines only as told by your doctor. Do not take aspirin, ibuprofen, or other NSAIDs unless your doctor says it is okay.  Do not use any tobacco products, including cigarettes, chewing tobacco, and e-cigarettes. If you need help quitting, ask your  doctor.  Wear loose clothes. Do not wear anything tight around your waist.  Raise (elevate) the head of your bed about 6 inches (15 cm).  Try to lower your stress. If you need help doing this, ask your doctor.  If you are overweight, lose an amount of weight that is healthy for you. Ask your doctor about a safe weight loss goal.  Keep all follow-up visits as told by your doctor. This is important. Contact a doctor if:  You have new symptoms.  You lose weight and you do not know why it is happening.  You have trouble swallowing, or it hurts to swallow.  You have wheezing or a cough that keeps happening.  Your symptoms do not get better with treatment.  You have heartburn often for more than two weeks. Get help right away if:  You have pain in your arms, neck, jaw, teeth, or back.  You feel sweaty, dizzy, or light-headed.  You have chest pain or shortness of breath.  You throw up (vomit) and your throw up looks like blood or coffee grounds.  Your poop (stool) is bloody or black. This information is not intended to replace advice given to you by your health care provider. Make sure you discuss any questions you have with your health care provider. Document Released: 10/28/2010 Document Revised: 07/24/2015 Document Reviewed: 06/12/2014 Elsevier Interactive Patient Education  2018 [employer]  

## 2017-02-03 NOTE — Progress Notes (Signed)
Careteam: Patient Care Team: Sharon SellerEubanks, Alekai Pocock K, NP as PCP - General (Geriatric Medicine)  Advanced Directive information Does Patient Have a Medical Advance Directive?: No  No Known Allergies  Chief Complaint  Patient presents with  . Medical Management of Chronic Issues    Pt is being seen for a 6 month routine visit. Pt reports some nausea off and on today.   Marland Kitchen. Health Maintenance    Pt wants flu vaccine but unsure due to some nausea     HPI: Patient is a 66 y.o. male seen in the office today for routine follow up. Fasting today for blood work. Reports feeling a little queasy today and has been going on for a few days off and on. Able to eat and drink without issue.   Not currently on any medication.  Has attempted to make changes in diet due to elevated cholesterol noted on last visit.  Cut back on sugar and carbohydrates    Review of Systems:  Review of Systems  Constitutional: Negative for chills, fever, malaise/fatigue and weight loss.  HENT: Negative for tinnitus.   Respiratory: Negative for cough, sputum production and shortness of breath.   Cardiovascular: Negative for chest pain, palpitations and leg swelling.  Gastrointestinal: Positive for heartburn (eating then laying down). Negative for abdominal pain, constipation and diarrhea.  Genitourinary: Negative for dysuria, frequency and urgency.  Musculoskeletal: Negative for back pain, falls, joint pain and myalgias.  Skin: Negative.  Negative for itching and rash.  Neurological: Negative for dizziness, weakness and headaches.  Psychiatric/Behavioral: Negative for depression and memory loss. The patient does not have insomnia.     History reviewed. No pertinent past medical history. History reviewed. No pertinent surgical history. Social History:   reports that  has never smoked. he has never used smokeless tobacco. He reports that he does not drink alcohol or use drugs.  History reviewed. No pertinent  family history.  Medications:   Medication List    as of 02/03/2017  4:00 PM   You have not been prescribed any medications.      Physical Exam:  Vitals:   02/03/17 1518  BP: (!) 142/84  Pulse: 82  Resp: 18  Temp: 98.4 F (36.9 C)  TempSrc: Oral  SpO2: 97%  Weight: 202 lb (91.6 kg)  Height: 6' (1.829 m)   Body mass index is 27.4 kg/m.  Physical Exam  Constitutional: He is oriented to person, place, and time. He appears well-developed and well-nourished. No distress.  HENT:  Head: Normocephalic and atraumatic.  Mouth/Throat: Oropharynx is clear and moist. No oropharyngeal exudate.  Cerumen impaction to bilateral ears  Eyes: Conjunctivae and EOM are normal. Pupils are equal, round, and reactive to light.  Neck: Normal range of motion. Neck supple.  Cardiovascular: Normal rate, regular rhythm and normal heart sounds.  Pulmonary/Chest: Effort normal and breath sounds normal.  Abdominal: Soft. Bowel sounds are normal.  Musculoskeletal: He exhibits no edema or tenderness.  Neurological: He is alert and oriented to person, place, and time.  Skin: Skin is warm and dry. He is not diaphoretic.  Psychiatric: He has a normal mood and affect.    Labs reviewed: Basic Metabolic Panel: Recent Labs    07/27/16 0818  NA 140  K 4.4  CL 107  CO2 23  GLUCOSE 100*  BUN 17  CREATININE 1.34*  CALCIUM 9.0   Liver Function Tests: Recent Labs    07/27/16 0818  AST 12  ALT 16  ALKPHOS 42  BILITOT 0.7  PROT 6.8  ALBUMIN 3.9   No results for input(s): LIPASE, AMYLASE in the last 8760 hours. No results for input(s): AMMONIA in the last 8760 hours. CBC: Recent Labs    07/27/16 0818  WBC 4.4  NEUTROABS 1,892  HGB 16.0  HCT 47.6  MCV 88.6  PLT 205   Lipid Panel: Recent Labs    07/27/16 0818  CHOL 204*  HDL 42  LDLCALC 136*  TRIG 130  CHOLHDL 4.9   TSH: No results for input(s): TSH in the last 8760 hours. A1C: No results found for:  HGBA1C   Assessment/Plan 1. Mixed hyperlipidemia -to cont dietary modifications, will follow up labs today - Lipid Panel - COMPLETE METABOLIC PANEL WITH GFR  2. Need for immunization against influenza - Flu vaccine HIGH DOSE PF (Fluzone High dose)  3. Gastroesophageal reflux disease without esophagitis -dietary and lifestyle modifications given. Educated on OTC PRN  4. Essential hypertension -encouraged dietary changes, blood pressures vary on review, will cont to monitor but may need medication if cont to remain elevated over 140  Next appt: 6 months, sooner if needed Lori Popowski K. Biagio BorgEubanks, AGNP  [facility] [phone](Monday-Friday 8 am - 5 pm) [phone] (after hours)

## 2017-06-15 ENCOUNTER — Other Ambulatory Visit: Payer: Self-pay

## 2017-06-15 ENCOUNTER — Encounter: Payer: Self-pay | Admitting: Physician Assistant

## 2017-06-15 ENCOUNTER — Ambulatory Visit: Payer: BLUE CROSS/BLUE SHIELD | Admitting: Physician Assistant

## 2017-06-15 VITALS — BP 140/72 | HR 97 | Temp 98.2°F | Resp 18 | Ht 72.0 in | Wt 202.0 lb

## 2017-06-15 DIAGNOSIS — R030 Elevated blood-pressure reading, without diagnosis of hypertension: Secondary | ICD-10-CM

## 2017-06-15 NOTE — Progress Notes (Signed)
06/17/2017 9:46 AM   DOB: 06/02/1950 / MRN: [id number]  SUBJECTIVE:  Antonio Washington is a 67 y.o. male presenting for recheck of blood pressure.  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  He denies a history of hypertension.  Denies history of difficulty with his heart and heart disease.  His blood pressures here are completely normal.  He has No Known Allergies.   He  has a past medical history of Hyperlipidemia.    He  reports that he has never smoked. He has never used smokeless tobacco. He reports that he does not drink alcohol or use drugs. He  reports that he currently engages in sexual activity. The patient  has no past surgical history on file.  His family history includes Cancer in his father.  Review of Systems  Constitutional: Negative for chills, diaphoresis and fever.  Eyes: Negative.   Respiratory: Negative for cough, hemoptysis, sputum production, shortness of breath and wheezing.   Cardiovascular: Negative for chest pain, orthopnea and leg swelling.  Gastrointestinal: Negative for abdominal pain, blood in stool, constipation, diarrhea, heartburn, melena, nausea and vomiting.  Genitourinary: Negative for dysuria, flank pain, frequency, hematuria and urgency.  Skin: Negative for rash.  Neurological: Negative for dizziness, sensory change, speech change, focal weakness and headaches.    The problem list and medications were reviewed and updated by myself where necessary and exist elsewhere in the encounter.   OBJECTIVE:  BP 140/72   Pulse 97   Temp 98.2 F (36.8 C) (Oral)   Resp 18   Ht 6' (1.829 m)   Wt 202 lb (91.6 kg)   SpO2 96%   BMI 27.40 kg/m   Physical Exam  Constitutional: He is oriented to person, place, and time. He appears well-developed. He is active.  Non-toxic appearance. He does not appear ill.  Eyes: Pupils are equal, round, and reactive to  light. Conjunctivae and EOM are normal.  Cardiovascular: Normal rate, regular rhythm, S1 normal, S2 normal, normal heart sounds, intact distal pulses and normal pulses. Exam reveals no gallop and no friction rub.  No murmur heard. Pulmonary/Chest: Effort normal. No stridor. No respiratory distress. He has no wheezes. He has no rales.  Abdominal: He exhibits no distension.  Musculoskeletal: Normal range of motion. He exhibits no edema.  Neurological: He is alert and oriented to person, place, and time. No cranial nerve deficit. Coordination normal.  Skin: Skin is warm and dry. He is not diaphoretic. No pallor.  Psychiatric: He has a normal mood and affect.  Nursing note and vitals reviewed.   No results found for this or any previous visit (from the past 72 hour(s)).  No results found.  ASSESSMENT AND PLAN:  Antonio Washington was seen today for hypertension.  Diagnoses and all orders for this visit:  Elevated blood pressure reading: I have checked his blood pressure several times today and some measures measure as low as 120/70.  He is not a good candidate for hypertension medication.  Advised that his options today are to allow me to perform a DOT physical on him which he declines composed a letter stating that he was seen here and that his blood pressures have historically been normal.    The patient is advised to call or return to clinic if he does not see an improvement in symptoms, or to seek the care of the closest emergency department if he worsens  with the above plan.   Deliah Boston, MHS, PA-C Primary [facility] at [facility] 06/17/2017 9:46 AM

## 2017-06-15 NOTE — Progress Notes (Signed)
Opened in error

## 2017-06-15 NOTE — Patient Instructions (Signed)
     IF you received an x-ray today, you will receive an invoice from [facility]. Please contact [facility] at [phone] with questions or concerns regarding your invoice.   IF you received labwork today, you will receive an invoice from [facility]. Please contact [facility] at [phone] with questions or concerns regarding your invoice.   Our billing staff will not be able to assist you with questions regarding bills from these companies.  You will be contacted with the lab results as soon as they are available. The fastest way to get your results is to activate your My Chart account. Instructions are located on the last page of this paperwork. If you have not heard from us regarding the results in 2 weeks, please contact this office.     

## 2017-06-20 ENCOUNTER — Ambulatory Visit: Payer: Self-pay | Admitting: Family Medicine

## 2017-09-27 ENCOUNTER — Ambulatory Visit: Payer: BLUE CROSS/BLUE SHIELD | Admitting: Podiatry

## 2017-09-27 ENCOUNTER — Encounter: Payer: Self-pay | Admitting: Podiatry

## 2017-09-27 DIAGNOSIS — B351 Tinea unguium: Secondary | ICD-10-CM

## 2017-09-27 DIAGNOSIS — M6702 Short Achilles tendon (acquired), left ankle: Secondary | ICD-10-CM

## 2017-09-27 DIAGNOSIS — M2011 Hallux valgus (acquired), right foot: Secondary | ICD-10-CM

## 2017-09-27 DIAGNOSIS — M79672 Pain in left foot: Secondary | ICD-10-CM

## 2017-09-27 DIAGNOSIS — M6701 Short Achilles tendon (acquired), right ankle: Secondary | ICD-10-CM | POA: Diagnosis not present

## 2017-09-27 DIAGNOSIS — M21961 Unspecified acquired deformity of right lower leg: Secondary | ICD-10-CM

## 2017-09-27 DIAGNOSIS — M79671 Pain in right foot: Secondary | ICD-10-CM | POA: Diagnosis not present

## 2017-09-27 NOTE — Patient Instructions (Signed)
Seen for peeling and tight skin both feet. Noted of tight Achilles tendon, bunion R>L, fungal skin and nail both feet. Need daily stretch exercise as instructed. May benefit from custom orthotics. Will call fro the benefits. For dry peeling and smelly skin, use Salsunblue shampoo scrub at the end of each shower. All nails debrided. Return in 3 month or sooner if problem continues.

## 2017-09-27 NOTE — Progress Notes (Signed)
SUBJECTIVE: 67 y.o. year old male presents complaining of peeling, stiff, tight skin on both feet x 4 years. Patient stands on feet all day at work.   Review of Systems  Constitutional: Negative.   HENT: Negative.   Eyes: Negative.   Respiratory: Negative.   Cardiovascular: Negative.   Gastrointestinal: Negative.   Genitourinary: Negative.   Musculoskeletal: Negative.   Skin: Negative.      OBJECTIVE: DERMATOLOGIC EXAMINATION: Dry peeling skin in between digits with foul odor both feet. Thick yellow and deformed nails 1-5 left, 1-3 right. Thick plantar callus under the first MPJ right foot. VASCULAR EXAMINATION OF LOWER LIMBS: All pedal pulses are palpable with normal pulsation.  Capillary Filling times within 3 seconds in all digits.  No edema or erythema noted. Temperature gradient from tibial crest to dorsum of foot is within normal bilateral.  NEUROLOGIC EXAMINATION OF THE LOWER LIMBS: Achilles DTR is present and within normal. Monofilament (Semmes-Weinstein 10-gm) sensory testing positive 6 out of 6, bilateral. Vibratory sensations(128Hz  turning fork) intact at medial and lateral forefoot bilateral.  Sharp and Dull discriminatory sensations at the plantar ball of hallux is intact bilateral.   MUSCULOSKELETAL EXAMINATION: Positive for tight Achilles tendon bilateral. Severe HAV with bunion right, mild bunion left. Forefoot varus bilateral.  ASSESSMENT: Achilles tendon contracture bilateral. 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 Tinea pedis both feet. Onychomycosis with nail deformity bilateral.  PLAN: Reviewed findings and available treatment options. Reviewed stretch exercise to practice daily for tight Achilles tendon. All nails debrided. Instructed to scrub both feet at the end of each shower with Salsunblue shampoo and keep in between toes dry. Use Cotton socks. Return in 3 month for palliation or sooner if problem continues.

## 2017-09-28 ENCOUNTER — Ambulatory Visit: Payer: Self-pay | Admitting: Podiatry

## 2017-11-03 ENCOUNTER — Encounter: Payer: Self-pay | Admitting: Urgent Care

## 2017-11-03 ENCOUNTER — Other Ambulatory Visit: Payer: Self-pay | Admitting: Urgent Care

## 2017-11-03 ENCOUNTER — Other Ambulatory Visit: Payer: Self-pay

## 2017-11-03 ENCOUNTER — Ambulatory Visit (INDEPENDENT_AMBULATORY_CARE_PROVIDER_SITE_OTHER): Payer: BLUE CROSS/BLUE SHIELD | Admitting: Urgent Care

## 2017-11-03 VITALS — BP 171/92 | HR 70 | Temp 98.0°F | Resp 18 | Ht 72.0 in | Wt 201.0 lb

## 2017-11-03 DIAGNOSIS — Z131 Encounter for screening for diabetes mellitus: Secondary | ICD-10-CM | POA: Diagnosis not present

## 2017-11-03 DIAGNOSIS — Z23 Encounter for immunization: Secondary | ICD-10-CM

## 2017-11-03 DIAGNOSIS — Z1321 Encounter for screening for nutritional disorder: Secondary | ICD-10-CM | POA: Diagnosis not present

## 2017-11-03 DIAGNOSIS — Z13228 Encounter for screening for other metabolic disorders: Secondary | ICD-10-CM | POA: Diagnosis not present

## 2017-11-03 DIAGNOSIS — Z0000 Encounter for general adult medical examination without abnormal findings: Secondary | ICD-10-CM | POA: Diagnosis not present

## 2017-11-03 DIAGNOSIS — Z130 Encounter for screening for diseases of the blood and blood-forming organs and certain disorders involving the immune mechanism: Secondary | ICD-10-CM

## 2017-11-03 DIAGNOSIS — Z1329 Encounter for screening for other suspected endocrine disorder: Secondary | ICD-10-CM

## 2017-11-03 DIAGNOSIS — L089 Local infection of the skin and subcutaneous tissue, unspecified: Secondary | ICD-10-CM

## 2017-11-03 DIAGNOSIS — I10 Essential (primary) hypertension: Secondary | ICD-10-CM

## 2017-11-03 DIAGNOSIS — R030 Elevated blood-pressure reading, without diagnosis of hypertension: Secondary | ICD-10-CM

## 2017-11-03 DIAGNOSIS — Z13220 Encounter for screening for lipoid disorders: Secondary | ICD-10-CM

## 2017-11-03 DIAGNOSIS — Z1211 Encounter for screening for malignant neoplasm of colon: Secondary | ICD-10-CM

## 2017-11-03 DIAGNOSIS — Z125 Encounter for screening for malignant neoplasm of prostate: Secondary | ICD-10-CM | POA: Diagnosis not present

## 2017-11-03 MED ORDER — ATORVASTATIN CALCIUM 20 MG PO TABS: 20.0000 mg | ORAL_TABLET | Freq: Every day | ORAL | 3 refills | Status: DC

## 2017-11-03 MED ORDER — MUPIROCIN 2 % EX OINT: 1.0000 "application " | TOPICAL_OINTMENT | Freq: Three times a day (TID) | CUTANEOUS | 0 refills | Status: AC

## 2017-11-03 MED ORDER — AMLODIPINE BESYLATE 5 MG PO TABS: 5.0000 mg | ORAL_TABLET | Freq: Every day | ORAL | 3 refills | Status: DC

## 2017-11-03 NOTE — Patient Instructions (Addendum)
Managing Your Hypertension Hypertension is commonly called high blood pressure. This is when the force of your blood pressing against the walls of your arteries is too strong. Arteries are blood vessels that carry blood from your heart throughout your body. Hypertension forces the heart to work harder to pump blood, and may cause the arteries to become narrow or stiff. Having untreated or uncontrolled hypertension can cause heart attack, stroke, kidney disease, and other problems. What are blood pressure readings? A blood pressure reading consists of a higher number over a lower number. Ideally, your blood pressure should be below 120/80. The first ("top") number is called the systolic pressure. It is a measure of the pressure in your arteries as your heart beats. The second ("bottom") number is called the diastolic pressure. It is a measure of the pressure in your arteries as the heart relaxes. What does my blood pressure reading mean? Blood pressure is classified into four stages. Based on your blood pressure reading, your health care provider may use the following stages to determine what type of treatment you need, if any. Systolic pressure and diastolic pressure are measured in a unit called mm Hg. Normal  Systolic pressure: below 120.  Diastolic pressure: below 80. Elevated  Systolic pressure: 120-129.  Diastolic pressure: below 80. Hypertension stage 1  Systolic pressure: 130-139.  Diastolic pressure: 80-89. Hypertension stage 2  Systolic pressure: 140 or above.  Diastolic pressure: 90 or above. What health risks are associated with hypertension? Managing your hypertension is an important responsibility. Uncontrolled hypertension can lead to:  A heart attack.  A stroke.  A weakened blood vessel (aneurysm).  Heart failure.  Kidney damage.  Eye damage.  Metabolic syndrome.  Memory and concentration problems.  What changes can I make to manage my  hypertension? Hypertension can be managed by making lifestyle changes and possibly by taking medicines. Your health care provider will help you make a plan to bring your blood pressure within a normal range. Eating and drinking  Eat a diet that is high in fiber and potassium, and low in salt (sodium), added sugar, and fat. An example eating plan is called the DASH (Dietary Approaches to Stop Hypertension) diet. To eat this way: ? Eat plenty of fresh fruits and vegetables. Try to fill half of your plate at each meal with fruits and vegetables. ? Eat whole grains, such as whole wheat pasta, brown rice, or whole grain bread. Fill about one quarter of your plate with whole grains. ? Eat low-fat diary products. ? Avoid fatty cuts of meat, processed or cured meats, and poultry with skin. Fill about one quarter of your plate with lean proteins such as fish, chicken without skin, beans, eggs, and tofu. ? Avoid premade and processed foods. These tend to be higher in sodium, added sugar, and fat.  Reduce your daily sodium intake. Most people with hypertension should eat less than 1,500 mg of sodium a day.  Limit alcohol intake to no more than 1 drink a day for nonpregnant women and 2 drinks a day for men. One drink equals 12 oz of beer, 5 oz of wine, or 1 oz of hard liquor. Lifestyle  Work with your health care provider to maintain a healthy body weight, or to lose weight. Ask what an ideal weight is for you.  Get at least 30 minutes of exercise that causes your heart to beat faster (aerobic exercise) most days of the week. Activities may include walking, swimming, or biking.  Include exercise   to strengthen your muscles (resistance exercise), such as weight lifting, as part of your weekly exercise routine. Try to do these types of exercises for 30 minutes at least 3 days a week.  Do not use any products that contain nicotine or tobacco, such as cigarettes and e-cigarettes. If you need help quitting, ask  your health care provider.  Control any long-term (chronic) conditions you have, such as high cholesterol or diabetes. Monitoring  Monitor your blood pressure at home as told by your health care provider. Your personal target blood pressure may vary depending on your medical conditions, your age, and other factors.  Have your blood pressure checked regularly, as often as told by your health care provider. Working with your health care provider  Review all the medicines you take with your health care provider because there may be side effects or interactions.  Talk with your health care provider about your diet, exercise habits, and other lifestyle factors that may be contributing to hypertension.  Visit your health care provider regularly. Your health care provider can help you create and adjust your plan for managing hypertension. Will I need medicine to control my blood pressure? Your health care provider may prescribe medicine if lifestyle changes are not enough to get your blood pressure under control, and if:  Your systolic blood pressure is 130 or higher.  Your diastolic blood pressure is 80 or higher.  Take medicines only as told by your health care provider. Follow the directions carefully. Blood pressure medicines must be taken as prescribed. The medicine does not work as well when you skip doses. Skipping doses also puts you at risk for problems. Contact a health care provider if:  You think you are having a reaction to medicines you have taken.  You have repeated (recurrent) headaches.  You feel dizzy.  You have swelling in your ankles.  You have trouble with your vision. Get help right away if:  You develop a severe headache or confusion.  You have unusual weakness or numbness, or you feel faint.  You have severe pain in your chest or abdomen.  You vomit repeatedly.  You have trouble breathing. Summary  Hypertension is when the force of blood pumping through  your arteries is too strong. If this condition is not controlled, it may put you at risk for serious complications.  Your personal target blood pressure may vary depending on your medical conditions, your age, and other factors. For most people, a normal blood pressure is less than 120/80.  Hypertension is managed by lifestyle changes, medicines, or both. Lifestyle changes include weight loss, eating a healthy, low-sodium diet, exercising more, and limiting alcohol. This information is not intended to replace advice given to you by your health care provider. Make sure you discuss any questions you have with your health care provider. Document Released: 11/10/2011 Document Revised: 01/14/2016 Document Reviewed: 01/14/2016 Elsevier Interactive Patient Education  2018 [employer].     Health Maintenance, Male A healthy lifestyle and preventive care is important for your health and wellness. Ask your health care provider about what schedule of regular examinations is right for you. What should I know about weight and diet? Eat a Healthy Diet  Eat plenty of vegetables, fruits, whole grains, low-fat dairy products, and lean protein.  Do not eat a lot of foods high in solid fats, added sugars, or salt.  Maintain a Healthy Weight Regular exercise can help you achieve or maintain a healthy weight. You should:  Do at  least 150 minutes of exercise each week. The exercise should increase your heart rate and make you sweat (moderate-intensity exercise).  Do strength-training exercises at least twice a week.  Watch Your Levels of Cholesterol and Blood Lipids  Have your blood tested for lipids and cholesterol every 5 years starting at 67 years of age. If you are at high risk for heart disease, you should start having your blood tested when you are 67 years old. You may need to have your cholesterol levels checked more often if: ? Your lipid or cholesterol levels are high. ? You are older than 67  years of age. ? You are at high risk for heart disease.  What should I know about cancer screening? Many types of cancers can be detected early and may often be prevented. Lung Cancer  You should be screened every year for lung cancer if: ? You are a current smoker who has smoked for at least 30 years. ? You are a former smoker who has quit within the past 15 years.  Talk to your health care provider about your screening options, when you should start screening, and how often you should be screened.  Colorectal Cancer  Routine colorectal cancer screening usually begins at 67 years of age and should be repeated every 5-10 years until you are 67 years old. You may need to be screened more often if early forms of precancerous polyps or small growths are found. Your health care provider may recommend screening at an earlier age if you have risk factors for colon cancer.  Your health care provider may recommend using home test kits to check for hidden blood in the stool.  A small camera at the end of a tube can be used to examine your colon (sigmoidoscopy or colonoscopy). This checks for the earliest forms of colorectal cancer.  Prostate and Testicular Cancer  Depending on your age and overall health, your health care provider may do certain tests to screen for prostate and testicular cancer.  Talk to your health care provider about any symptoms or concerns you have about testicular or prostate cancer.  Skin Cancer  Check your skin from head to toe regularly.  Tell your health care provider about any new moles or changes in moles, especially if: ? There is a change in a mole's size, shape, or color. ? You have a mole that is larger than a pencil eraser.  Always use sunscreen. Apply sunscreen liberally and repeat throughout the day.  Protect yourself by wearing long sleeves, pants, a wide-brimmed hat, and sunglasses when outside.  What should I know about heart disease, diabetes, and  high blood pressure?  If you are 44-48 years of age, have your blood pressure checked every 3-5 years. If you are 90 years of age or older, have your blood pressure checked every year. You should have your blood pressure measured twice-once when you are at a hospital or clinic, and once when you are not at a hospital or clinic. Record the average of the two measurements. To check your blood pressure when you are not at a hospital or clinic, you can use: ? An automated blood pressure machine at a pharmacy. ? A home blood pressure monitor.  Talk to your health care provider about your target blood pressure.  If you are between 13-68 years old, ask your health care provider if you should take aspirin to prevent heart disease.  Have regular diabetes screenings by checking your fasting blood sugar level. ?  If you are at a normal weight and have a low risk for diabetes, have this test once every three years after the age of 19. ? If you are overweight and have a high risk for diabetes, consider being tested at a younger age or more often.  A one-time screening for abdominal aortic aneurysm (AAA) by ultrasound is recommended for men aged 65-75 years who are current or former smokers. What should I know about preventing infection? Hepatitis B If you have a higher risk for hepatitis B, you should be screened for this virus. Talk with your health care provider to find out if you are at risk for hepatitis B infection. Hepatitis C Blood testing is recommended for:  Everyone born from [birth year] through 1965.  Anyone with known risk factors for hepatitis C.  Sexually Transmitted Diseases (STDs)  You should be screened each year for STDs including gonorrhea and chlamydia if: ? You are sexually active and are younger than 67 years of age. ? You are older than 67 years of age and your health care provider tells you that you are at risk for this type of infection. ? Your sexual activity has changed since you  were last screened and you are at an increased risk for chlamydia or gonorrhea. Ask your health care provider if you are at risk.  Talk with your health care provider about whether you are at high risk of being infected with HIV. Your health care provider may recommend a prescription medicine to help prevent HIV infection.  What else can I do?  Schedule regular health, dental, and eye exams.  Stay current with your vaccines (immunizations).  Do not use any tobacco products, such as cigarettes, chewing tobacco, and e-cigarettes. If you need help quitting, ask your health care provider.  Limit alcohol intake to no more than 2 drinks per day. One drink equals 12 ounces of beer, 5 ounces of wine, or 1 ounces of hard liquor.  Do not use street drugs.  Do not share needles.  Ask your health care provider for help if you need support or information about quitting drugs.  Tell your health care provider if you often feel depressed.  Tell your health care provider if you have ever been abused or do not feel safe at home. This information is not intended to replace advice given to you by your health care provider. Make sure you discuss any questions you have with your health care provider. Document Released: 08/14/2007 Document Revised: 10/15/2015 Document Reviewed: 11/19/2014 Elsevier Interactive Patient Education  [employer].  If you have lab work done today you will be contacted with your lab results within the next 2 weeks.  If you have not heard from [place] then please contact us. The fastest way to get your results is to register for My Chart.   IF you received an x-ray today, you will receive an invoice from [facility]. Please contact [facility] at [phone] with questions or concerns regarding your invoice.   IF you received labwork today, you will receive an invoice from [facility]. Please contact [facility] at [phone] with questions or concerns regarding  your invoice.   Our billing staff will not be able to assist you with questions regarding bills from these companies.  You will be contacted with the lab results as soon as they are available. The fastest way to get your results is to activate your My Chart account. Instructions are located on the last page of this paperwork.  If you have not heard from [place] regarding the results in 2 weeks, please contact this office.

## 2017-11-03 NOTE — Progress Notes (Signed)
MRN: [id number]  Subjective:   [name] is a 67 y.o. male presenting for annual physical exam. Patient is single, works as a Midwife.  reports that he has never smoked. He has never used smokeless tobacco. He reports that he does not drink alcohol or use drugs.   Medical care team includes: PCP: Sharon Seller, NP Vision: Wears reading glasses. Has never had an eye exam with an ophthalmologist.  Dental: Plans on getting dental care soon.  Specialists: None.  Antonio Washington is not currently taking any medications and has No Known Allergies. Antonio Washington  has a past medical history of Asthma and Hyperlipidemia. Also  has no past surgical history on file.  His family history includes Cancer in his father. Father passed from prostate cancer.   Immunization History  Administered Date(s) Administered  . Influenza, High Dose Seasonal PF 02/03/2017  . Influenza-Unspecified 03/01/2016  . Pneumococcal Conjugate-13 07/29/2016, 11/03/2017  . Td 06/30/2006    ROS  Objective:   Vitals: BP (!) 171/92   Pulse 70   Temp 98 F (36.7 C) (Oral)   Resp 18   Ht 6' (1.829 m)   Wt 201 lb (91.2 kg)   SpO2 96%   BMI 27.26 kg/m   BP Readings from Last 3 Encounters:  11/03/17 (!) 171/92  06/15/17 140/72  02/03/17 (!) 142/84    Physical Exam  Constitutional: He is oriented to person, place, and time. He appears well-developed and well-nourished.  HENT:  TM's intact bilaterally, no effusions or erythema. Nasal turbinates pink and moist, nasal passages patent. No sinus tenderness. Oropharynx clear, mucous membranes moist, dentition in good repair.  Eyes: Pupils are equal, round, and reactive to light. Conjunctivae and EOM are normal. Right eye exhibits no discharge. Left eye exhibits no discharge. No scleral icterus.  Neck: Normal range of motion. Neck supple. No JVD present. No thyromegaly present.  Cardiovascular: Normal rate, regular rhythm, normal heart sounds and intact distal pulses.  Exam reveals no gallop and no friction rub.  No murmur heard. Pulmonary/Chest: No stridor. No respiratory distress. He has no wheezes. He has no rales.  Abdominal: Soft. Bowel sounds are normal. He exhibits no distension and no mass. There is no tenderness. There is no rebound and no guarding.  Musculoskeletal: Normal range of motion. He exhibits no edema or tenderness.  Lymphadenopathy:    He has no cervical adenopathy.  Neurological: He is alert and oriented to person, place, and time. He has normal reflexes. He displays normal reflexes. No cranial nerve deficit. Coordination normal.  Skin: Skin is warm and dry. No rash noted. No erythema. No pallor.  Psychiatric: He has a normal mood and affect.   Assessment and Plan :   Annual physical exam - Plan: CBC, Comprehensive metabolic panel, Lipid panel, CANCELED: TSH  Screening for deficiency anemia - Plan: CBC  Screening for thyroid disorder - Plan: TSH + free T4, CANCELED: TSH  Screening for metabolic disorder - Plan: Comprehensive metabolic panel  Screening for hyperlipidemia - Plan: Lipid panel  Screening for prostate cancer - Plan: PSA  Screen for colon cancer - Plan: CANCELED: Cologuard  Encounter for vitamin deficiency screening - Plan: VITAMIN D 25 Hydroxy (Vit-D Deficiency, Fractures)  Screening for diabetes mellitus - Plan: Hemoglobin A1c  Essential hypertension  Elevated blood pressure reading  Superficial skin infection  Patient is stable, will have him start amlodipine for his blood pressure.  Labs pending.  He is also to start a cholesterol medicine.  Use mupirocin for skin infection. Counseled patient on potential for adverse effects with medications prescribed today, patient verbalized understanding. Return-to-clinic precautions discussed, patient verbalized understanding. Discussed healthy lifestyle, diet, exercise, preventative care, vaccinations, and addressed patient's concerns.     Wallis Bamberg, PA-C Primary  Care at [facility] [phone] 11/03/2017  3:01 PM

## 2017-11-04 LAB — COMPREHENSIVE METABOLIC PANEL
A/G RATIO: 1.6 (ref 1.2–2.2)
ALBUMIN: 4.3 g/dL (ref 3.6–4.8)
ALK PHOS: 44 IU/L (ref 39–117)
ALT: 19 IU/L (ref 0–44)
AST: 12 IU/L (ref 0–40)
BILIRUBIN TOTAL: 0.7 mg/dL (ref 0.0–1.2)
BUN / CREAT RATIO: 13 (ref 10–24)
BUN: 16 mg/dL (ref 8–27)
CHLORIDE: 105 mmol/L (ref 96–106)
CO2: 19 mmol/L — AB (ref 20–29)
CREATININE: 1.25 mg/dL (ref 0.76–1.27)
Calcium: 9.1 mg/dL (ref 8.6–10.2)
GFR calc Af Amer: 69 mL/min/{1.73_m2} (ref >59)
GFR calc non Af Amer: 60 mL/min/{1.73_m2} (ref >59)
Globulin, Total: 2.7 g/dL (ref 1.5–4.5)
Glucose: 82 mg/dL (ref 65–99)
Potassium: 4.2 mmol/L (ref 3.5–5.2)
Sodium: 140 mmol/L (ref 134–144)
Total Protein: 7 g/dL (ref 6.0–8.5)

## 2017-11-04 LAB — TSH+FREE T4
Free T4: 1.41 ng/dL (ref 0.82–1.77)
TSH: 0.515 u[IU]/mL (ref 0.450–4.500)

## 2017-11-04 LAB — CBC
Hematocrit: 48.1 % (ref 37.5–51.0)
Hemoglobin: 16.5 g/dL (ref 13.0–17.7)
MCH: 29.9 pg (ref 26.6–33.0)
MCHC: 34.3 g/dL (ref 31.5–35.7)
MCV: 87 fL (ref 79–97)
Platelets: 195 10*3/uL (ref 150–450)
RBC: 5.52 x10E6/uL (ref 4.14–5.80)
RDW: 13.9 % (ref 12.3–15.4)
WBC: 4.9 10*3/uL (ref 3.4–10.8)

## 2017-11-04 LAB — LIPID PANEL
CHOLESTEROL TOTAL: 186 mg/dL (ref 100–199)
Chol/HDL Ratio: 4.5 ratio (ref 0.0–5.0)
HDL: 41 mg/dL (ref >39)
LDL CALC: 117 mg/dL — AB (ref 0–99)
Triglycerides: 142 mg/dL (ref 0–149)
VLDL CHOLESTEROL CAL: 28 mg/dL (ref 5–40)

## 2017-11-04 LAB — HEMOGLOBIN A1C
ESTIMATED AVERAGE GLUCOSE: 123 mg/dL
HEMOGLOBIN A1C: 5.9 % — AB (ref 4.8–5.6)

## 2017-11-04 LAB — URIC A+ANA+CRP+RF QN
Anti Nuclear Antibody(ANA): NEGATIVE
CRP: 2 mg/L (ref 0–10)
Rhuematoid fact SerPl-aCnc: <10 IU/mL (ref 0.0–13.9)
Uric Acid: 6.5 mg/dL (ref 3.7–8.6)

## 2017-11-04 LAB — VITAMIN D 25 HYDROXY (VIT D DEFICIENCY, FRACTURES): Vit D, 25-Hydroxy: 21.7 ng/mL — ABNORMAL LOW (ref 30.0–100.0)

## 2017-11-04 LAB — PSA: PROSTATE SPECIFIC AG, SERUM: 1.2 ng/mL (ref 0.0–4.0)

## 2017-11-08 ENCOUNTER — Other Ambulatory Visit: Payer: Self-pay | Admitting: Urgent Care

## 2017-11-08 MED ORDER — VITAMIN D 50 MCG (2000 UT) PO TABS: 2000.0000 [IU] | ORAL_TABLET | Freq: Every day | ORAL | 1 refills | Status: DC

## 2017-11-11 ENCOUNTER — Ambulatory Visit (INDEPENDENT_AMBULATORY_CARE_PROVIDER_SITE_OTHER): Payer: BLUE CROSS/BLUE SHIELD

## 2017-11-11 ENCOUNTER — Ambulatory Visit: Payer: BLUE CROSS/BLUE SHIELD | Admitting: Urgent Care

## 2017-11-11 ENCOUNTER — Encounter: Payer: Self-pay | Admitting: Urgent Care

## 2017-11-11 VITALS — BP 152/94 | HR 75 | Temp 98.2°F | Resp 18 | Ht 72.0 in | Wt 201.6 lb

## 2017-11-11 DIAGNOSIS — R200 Anesthesia of skin: Secondary | ICD-10-CM | POA: Diagnosis not present

## 2017-11-11 DIAGNOSIS — R4582 Worries: Secondary | ICD-10-CM

## 2017-11-11 DIAGNOSIS — M5412 Radiculopathy, cervical region: Secondary | ICD-10-CM

## 2017-11-11 DIAGNOSIS — M9979 Connective tissue and disc stenosis of intervertebral foramina of abdomen and other regions: Secondary | ICD-10-CM

## 2017-11-11 DIAGNOSIS — M5030 Other cervical disc degeneration, unspecified cervical region: Secondary | ICD-10-CM

## 2017-11-11 DIAGNOSIS — R030 Elevated blood-pressure reading, without diagnosis of hypertension: Secondary | ICD-10-CM

## 2017-11-11 DIAGNOSIS — I10 Essential (primary) hypertension: Secondary | ICD-10-CM

## 2017-11-11 DIAGNOSIS — R202 Paresthesia of skin: Secondary | ICD-10-CM

## 2017-11-11 DIAGNOSIS — Z7251 High risk heterosexual behavior: Secondary | ICD-10-CM

## 2017-11-11 DIAGNOSIS — M4802 Spinal stenosis, cervical region: Secondary | ICD-10-CM | POA: Diagnosis not present

## 2017-11-11 MED ORDER — PREDNISONE 20 MG PO TABS: ORAL_TABLET | ORAL | 0 refills | Status: DC

## 2017-11-11 NOTE — Progress Notes (Signed)
MRN: [id number] DOB: 1950-07-09  Subjective:   Antonio Washington is a 67 y.o. male presenting for 3 week history of radicular right arm pain/discomfort, tingling. Patient works as a Hospital doctordriver, drives a bus. Got bit by 2 ants ~2 weeks ago and thought this might be related. Has tried Aleve with good relieve. Denies falls, trauma, heavy lifting, arthritis. Patient would like to have STI testing. Has unprotected sex. Denies fever, dysuria, hematuria, urinary frequency, penile discharge, penile swelling, testicular pain, testicular swelling, anal pain, groin pain.  reports that he has never smoked. He has never used smokeless tobacco. He reports that he does not drink alcohol or use drugs.   Antonio Washington has a current medication list which includes the following prescription(s): amlodipine, atorvastatin, vitamin d, and mupirocin ointment. Also has No Known Allergies.  Antonio Washington  has a past medical history of Asthma and Hyperlipidemia. Denies past surgical history.   Objective:   Vitals: BP (!) 152/94   Pulse 75   Temp 98.2 F (36.8 C) (Oral)   Resp 18   Ht 6' (1.829 m)   Wt 201 lb 9.6 oz (91.4 kg)   SpO2 96%   BMI 27.34 kg/m   BP Readings from Last 3 Encounters:  11/11/17 (!) 152/94  11/03/17 (!) 171/92  06/15/17 140/72    Physical Exam  Constitutional: He is oriented to person, place, and time. He appears well-developed and well-nourished.  Cardiovascular: Normal rate, regular rhythm, normal heart sounds and intact distal pulses. Exam reveals no gallop and no friction rub.  No murmur heard. Pulmonary/Chest: Effort normal and breath sounds normal. No stridor. No respiratory distress. He has no wheezes. He has no rales.  Musculoskeletal:       Cervical back: He exhibits normal range of motion, no tenderness, no bony tenderness, no swelling, no edema, no deformity, no laceration and no spasm.  Negative Spurling maneuver.  Neurological: He is alert and oriented to person, place, and time. He  displays normal reflexes. Coordination normal.   Dg Cervical Spine Complete  Result Date: 11/11/2017 CLINICAL DATA:  Right arm tingling EXAM: CERVICAL SPINE - COMPLETE 4+ VIEW COMPARISON:  12/24/2012 FINDINGS: Degenerative disc disease with disc space narrowing and spurring from C4-5 thru C6-7. Uncovertebral spurring causes bilateral multilevel neural foraminal narrowing, on the right at C3-4 through C5-6 and diffusely on the left. Degenerative facet disease bilaterally. No fracture or malalignment. Prevertebral soft tissues are normal. IMPRESSION: Degenerative disc and facet disease. Bilateral neural foraminal narrowing as above. No real change since prior study. No acute bony abnormality. Electronically Signed   By: Charlett NoseKevin  Dover M.D.   On: 11/11/2017 09:32     Assessment and Plan :   Numbness and tingling of right arm - Plan: DG Cervical Spine Complete, Ambulatory referral to Orthopedic Surgery  Cervical radicular pain - Plan: DG Cervical Spine Complete, Ambulatory referral to Orthopedic Surgery  Worries - Plan: HIV Antibody (routine testing w rflx), RPR, GC/Chlamydia Probe Amp, Trichomonas vaginalis, RNA  Unprotected sex  Essential hypertension  Elevated blood pressure reading  Degenerative disc disease, cervical - Plan: Ambulatory referral to Orthopedic Surgery  Narrowing of intervertebral disc space - Plan: Ambulatory referral to Orthopedic Surgery  Patient will start short steroid course, referral to orthopedics is pending.  We will increase his amlodipine from 5 mg to 10 mg.  Patient's blood pressure is improved.  STI testing pending. Counseled patient on potential for adverse effects with medications prescribed today, patient verbalized understanding. Return-to-clinic precautions discussed, patient  verbalized understanding.   Wallis Bamberg, PA-C Primary Care at [facility] [phone] 11/11/2017  9:00 AM

## 2017-11-11 NOTE — Patient Instructions (Addendum)
For your blood pressure, start taking amlodipine at 10mg  once daily. That's 2 pills of the 5mg  supply that you have now.    If you have lab work done today you will be contacted with your lab results within the next 2 weeks.  If you have not heard from us then please contact us. The fastest way to get your results is to register for My Chart.   Degenerative Disk Disease Degenerative disk disease is a condition caused by the changes that occur in spinal disks as you grow older. Spinal disks are soft and compressible disks located between the bones of your spine (vertebrae). These disks act like shock absorbers. Degenerative disk disease can affect the whole spine. However, the neck and lower back are most commonly affected. Many changes can occur in the spinal disks with aging, such as:  The spinal disks may dry and shrink.  Small tears may occur in the tough, outer covering of the disk (annulus).  The disk space may become smaller due to loss of water.  Abnormal growths in the bone (spurs) may occur. This can put pressure on the nerve roots exiting the spinal canal, causing pain.  The spinal canal may become narrowed.  What increases the risk?  Being overweight.  Having a family history of degenerative disk disease.  Smoking.  There is increased risk if you are doing heavy lifting or have a sudden injury. What are the signs or symptoms? Symptoms vary from person to person and may include:  Pain that varies in intensity. Some people have no pain, while others have severe pain. The location of the pain depends on the part of your backbone that is affected. ? You will have neck or arm pain if a disk in the neck area is affected. ? You will have pain in your back, buttocks, or legs if a disk in the lower back is affected.  Pain that becomes worse while bending, reaching up, or with twisting movements.  Pain that may start gradually and then get worse as time passes. It may also start  after a major or minor injury.  Numbness or tingling in the arms or legs.  How is this diagnosed? Your health care provider will ask you about your symptoms and about activities or habits that may cause the pain. He or she may also ask about any injuries, diseases, or treatments you have had. Your health care provider will examine you to check for the range of movement that is possible in the affected area, to check for strength in your extremities, and to check for sensation in the areas of the arms and legs supplied by different nerve roots. You may also have:  An X-ray of the spine.  Other imaging tests, such as MRI.  How is this treated? Your health care provider will advise you on the best plan for treatment. Treatment may include:  Medicines.  Rehabilitation exercises.  Follow these instructions at home:  Follow proper lifting and walking techniques as advised by your health care provider.  Maintain good posture.  Exercise regularly as advised by your health care provider.  Perform relaxation exercises.  Change your sitting, standing, and sleeping habits as advised by your health care provider.  Change positions frequently.  Lose weight or maintain a healthy weight as advised by your health care provider.  Do not use any tobacco products, including cigarettes, chewing tobacco, or electronic cigarettes. If you need help quitting, ask your health care provider.  Wear  supportive footwear.  Take medicines only as directed by your health care provider. Contact a health care provider if:  Your pain does not go away within 1-4 weeks.  You have significant appetite or weight loss. Get help right away if:  Your pain is severe.  You notice weakness in your arms, hands, or legs.  You begin to lose control of your bladder or bowel movements.  You have fevers or night sweats. This information is not intended to replace advice given to you by your health care provider.  Make sure you discuss any questions you have with your health care provider. Document Released: 12/13/2006 Document Revised: 07/24/2015 Document Reviewed: 06/19/2013 Elsevier Interactive Patient Education  2018 [employer].    IF you received an x-ray today, you will receive an invoice from [facility]. Please contact [facility] at [phone] with questions or concerns regarding your invoice.   IF you received labwork today, you will receive an invoice from [facility]. Please contact [facility] at [phone] with questions or concerns regarding your invoice.   Our billing staff will not be able to assist you with questions regarding bills from these companies.  You will be contacted with the lab results as soon as they are available. The fastest way to get your results is to activate your My Chart account. Instructions are located on the last page of this paperwork. If you have not heard from [place] regarding the results in 2 weeks, please contact this office.

## 2017-11-12 LAB — RPR: RPR: NONREACTIVE

## 2017-11-12 LAB — HIV ANTIBODY (ROUTINE TESTING W REFLEX): HIV Screen 4th Generation wRfx: NONREACTIVE

## 2017-11-13 LAB — GC/CHLAMYDIA PROBE AMP
Chlamydia trachomatis, NAA: NEGATIVE
Neisseria gonorrhoeae by PCR: NEGATIVE

## 2017-11-13 LAB — TRICHOMONAS VAGINALIS, PROBE AMP: Trich vag by NAA: NEGATIVE

## 2017-11-18 DIAGNOSIS — M5412 Radiculopathy, cervical region: Secondary | ICD-10-CM | POA: Diagnosis not present

## 2017-11-18 DIAGNOSIS — M5030 Other cervical disc degeneration, unspecified cervical region: Secondary | ICD-10-CM | POA: Diagnosis not present

## 2017-11-29 DIAGNOSIS — M542 Cervicalgia: Secondary | ICD-10-CM | POA: Diagnosis not present

## 2017-12-02 ENCOUNTER — Ambulatory Visit (INDEPENDENT_AMBULATORY_CARE_PROVIDER_SITE_OTHER): Payer: BLUE CROSS/BLUE SHIELD | Admitting: Physician Assistant

## 2017-12-02 ENCOUNTER — Other Ambulatory Visit: Payer: Self-pay

## 2017-12-02 ENCOUNTER — Encounter: Payer: Self-pay | Admitting: Physician Assistant

## 2017-12-02 VITALS — BP 150/83 | HR 85 | Temp 98.5°F | Resp 18 | Ht 70.87 in | Wt 201.8 lb

## 2017-12-02 DIAGNOSIS — I10 Essential (primary) hypertension: Secondary | ICD-10-CM

## 2017-12-02 MED ORDER — AMLODIPINE BESY-BENAZEPRIL HCL 10-20 MG PO CAPS: 1.0000 | ORAL_CAPSULE | Freq: Every day | ORAL | 1 refills | Status: DC

## 2017-12-02 NOTE — Progress Notes (Signed)
   Antonio Washington  MRN: [id number] DOB: [birth year]  PCP: Sharon Seller, NP  Subjective:  Pt is a 67 year old male who presents to clinic for blood pressure check.   HTN - amlodipine 10 mg. Blood pressure today is 150/83. Endorses medication compliance. Home blood pressures are around 140's/90 Denies lightheadedness, dizziness, chronic headache, double vision, chest pain, shortness of breath, heart racing, palpitations, nausea, vomiting, abdominal pain, hematuria, lower leg swelling.   BP Readings from Last 3 Encounters:  12/02/17 (!) 150/83  11/11/17 (!) 152/94  11/03/17 (!) 171/92    Review of Systems  Constitutional: Negative for diaphoresis and fatigue.  Respiratory: Negative for cough and shortness of breath.   Cardiovascular: Negative for chest pain, palpitations and leg swelling.    Patient Active Problem List   Diagnosis Date Noted  . Chest pain at rest 12/24/2012  . Acute renal insufficiency 12/24/2012  . FATIGUE, ACUTE 07/24/2009    Current Outpatient Medications on File Prior to Visit  Medication Sig Dispense Refill  . amLODipine (NORVASC) 5 MG tablet Take 1 tablet (5 mg total) by mouth daily. 90 tablet 3  . atorvastatin (LIPITOR) 20 MG tablet Take 1 tablet (20 mg total) by mouth daily. 90 tablet 3  . Cholecalciferol (VITAMIN D) 2000 units tablet Take 1 tablet (2,000 Units total) by mouth daily. 90 tablet 1  . mupirocin ointment (BACTROBAN) 2 % Apply 1 application topically 3 (three) times daily. 30 g 0  . predniSONE (DELTASONE) 20 MG tablet Day 1-5: Take 2 tablets daily with breakfast. Day 6-10: Take 1 tablet with breakfast. (Patient not taking: Reported on 12/02/2017) 15 tablet 0   No current facility-administered medications on file prior to visit.     No Known Allergies   Objective:  BP (!) 150/83   Pulse 85   Temp 98.5 F (36.9 C) (Oral)   Resp 18   Ht 5' 10.87" (1.8 m)   Wt 201 lb 12.8 oz (91.5 kg)   SpO2 97%   BMI 28.25 kg/m   Physical  Exam  Constitutional: He is oriented to person, place, and time. He appears well-developed and well-nourished.  Cardiovascular: Normal rate and regular rhythm.  Neurological: He is alert and oriented to person, place, and time.  Skin: Skin is warm and dry.  Psychiatric: He has a normal mood and affect. His behavior is normal. Judgment and thought content normal.  Vitals reviewed.   Assessment and Plan :  1. Essential hypertension - Uncontrolled with amlodipine 10 mg. Blood pressure today is 150/83. Add benazepril 20mg . RTC in 2 weeks to recheck. DASH diet encouraged.  - amLODipine-benazepril (LOTREL) 10-20 MG capsule; Take 1 capsule by mouth daily.  Dispense: 90 capsule; Refill: 1   Whitney Damica Gravlin, PA-C  [facility] at [facility] 12/02/2017 3:12 PM  Please note: Portions of this report may have been transcribed using dragon voice recognition software. Every effort was made to ensure accuracy; however, inadvertent computerized transcription errors may be present.

## 2017-12-02 NOTE — Patient Instructions (Addendum)
Stop taking Norvasc (amplodipine) 10mg  Start taking amLODipine-benazepril (LOTREL) 10-20 MG capsule; Take 1 capsule daily.  See below for tips about foods to eat to help bring down your blood pressures.  Come back and see me in 2 weeks.   DASH Eating Plan DASH stands for "Dietary Approaches to Stop Hypertension." The DASH eating plan is a healthy eating plan that has been shown to reduce high blood pressure (hypertension). It may also reduce your risk for type 2 diabetes, heart disease, and stroke. The DASH eating plan may also help with weight loss. What are tips for following this plan? General guidelines  Avoid eating more than 2,300 mg (milligrams) of salt (sodium) a day. If you have hypertension, you may need to reduce your sodium intake to 1,500 mg a day.  Limit alcohol intake to no more than 1 drink a day for nonpregnant women and 2 drinks a day for men. One drink equals 12 oz of beer, 5 oz of wine, or 1 oz of hard liquor.  Work with your health care provider to maintain a healthy body weight or to lose weight. Ask what an ideal weight is for you.  Get at least 30 minutes of exercise that causes your heart to beat faster (aerobic exercise) most days of the week. Activities may include walking, swimming, or biking.  Work with your health care provider or diet and nutrition specialist (dietitian) to adjust your eating plan to your individual calorie needs. Reading food labels  Check food labels for the amount of sodium per serving. Choose foods with less than 5 percent of the Daily Value of sodium. Generally, foods with less than 300 mg of sodium per serving fit into this eating plan.  To find whole grains, look for the word "whole" as the first word in the ingredient list. Shopping  Buy products labeled as "low-sodium" or "no salt added."  Buy fresh foods. Avoid canned foods and premade or frozen meals. Cooking  Avoid adding salt when cooking. Use salt-free seasonings or  herbs instead of table salt or sea salt. Check with your health care provider or pharmacist before using salt substitutes.  Do not fry foods. Cook foods using healthy methods such as baking, boiling, grilling, and broiling instead.  Cook with heart-healthy oils, such as olive, canola, soybean, or sunflower oil. Meal planning   Eat a balanced diet that includes: ? 5 or more servings of fruits and vegetables each day. At each meal, try to fill half of your plate with fruits and vegetables. ? Up to 6-8 servings of whole grains each day. ? Less than 6 oz of lean meat, poultry, or fish each day. A 3-oz serving of meat is about the same size as a deck of cards. One egg equals 1 oz. ? 2 servings of low-fat dairy each day. ? A serving of nuts, seeds, or beans 5 times each week. ? Heart-healthy fats. Healthy fats called Omega-3 fatty acids are found in foods such as flaxseeds and coldwater fish, like sardines, salmon, and mackerel.  Limit how much you eat of the following: ? Canned or prepackaged foods. ? Food that is high in trans fat, such as fried foods. ? Food that is high in saturated fat, such as fatty meat. ? Sweets, desserts, sugary drinks, and other foods with added sugar. ? Full-fat dairy products.  Do not salt foods before eating.  Try to eat at least 2 vegetarian meals each week.  Eat more home-cooked food and less restaurant,  buffet, and fast food.  When eating at a restaurant, ask that your food be prepared with less salt or no salt, if possible. What foods are recommended? The items listed may not be a complete list. Talk with your dietitian about what dietary choices are best for you. Grains Whole-grain or whole-wheat bread. Whole-grain or whole-wheat pasta. Brown rice. [place]row. Bulgur. Whole-grain and low-sodium cereals. Pita bread. Low-fat, low-sodium crackers. Whole-wheat flour tortillas. Vegetables Fresh or frozen vegetables (raw, steamed, roasted, or grilled).  Low-sodium or reduced-sodium tomato and vegetable juice. Low-sodium or reduced-sodium tomato sauce and tomato paste. Low-sodium or reduced-sodium canned vegetables. Fruits All fresh, dried, or frozen fruit. Canned fruit in natural juice (without added sugar). Meat and other protein foods Skinless chicken or [place]. Ground chicken or [place]. Pork with fat trimmed off. Fish and seafood. Egg whites. Dried beans, peas, or lentils. Unsalted nuts, nut butters, and seeds. Unsalted canned beans. Lean cuts of beef with fat trimmed off. Low-sodium, lean deli meat. Dairy Low-fat (1%) or fat-free (skim) milk. Fat-free, low-fat, or reduced-fat cheeses. Nonfat, low-sodium ricotta or cottage cheese. Low-fat or nonfat yogurt. Low-fat, low-sodium cheese. Fats and oils Soft margarine without trans fats. Vegetable oil. Low-fat, reduced-fat, or light mayonnaise and salad dressings (reduced-sodium). Canola, safflower, olive, soybean, and sunflower oils. Avocado. Seasoning and other foods Herbs. Spices. Seasoning mixes without salt. Unsalted popcorn and pretzels. Fat-free sweets. What foods are not recommended? The items listed may not be a complete list. Talk with your dietitian about what dietary choices are best for you. Grains Baked goods made with fat, such as croissants, muffins, or some breads. Dry pasta or rice meal packs. Vegetables Creamed or fried vegetables. Vegetables in a cheese sauce. Regular canned vegetables (not low-sodium or reduced-sodium). Regular canned tomato sauce and paste (not low-sodium or reduced-sodium). Regular tomato and vegetable juice (not low-sodium or reduced-sodium). Angie Fava. Olives. Fruits Canned fruit in a light or heavy syrup. Fried fruit. Fruit in cream or butter sauce. Meat and other protein foods Fatty cuts of meat. Ribs. Fried meat. Berniece Salines. Sausage. Bologna and other processed lunch meats. Salami. Fatback. Hotdogs. Bratwurst. Salted nuts and seeds. Canned beans with added  salt. Canned or smoked fish. Whole eggs or egg yolks. Chicken or [place] with skin. Dairy Whole or 2% milk, cream, and half-and-half. Whole or full-fat cream cheese. Whole-fat or sweetened yogurt. Full-fat cheese. Nondairy creamers. Whipped toppings. Processed cheese and cheese spreads. Fats and oils Butter. Stick margarine. Lard. Shortening. Ghee. Bacon fat. Tropical oils, such as coconut, palm kernel, or palm oil. Seasoning and other foods Salted popcorn and pretzels. Onion salt, garlic salt, seasoned salt, table salt, and sea salt. Worcestershire sauce. Tartar sauce. Barbecue sauce. Teriyaki sauce. Soy sauce, including reduced-sodium. Steak sauce. Canned and packaged gravies. Fish sauce. Oyster sauce. Cocktail sauce. Horseradish that you find on the shelf. Ketchup. Mustard. Meat flavorings and tenderizers. Bouillon cubes. Hot sauce and Tabasco sauce. Premade or packaged marinades. Premade or packaged taco seasonings. Relishes. Regular salad dressings. Where to find more information:  National Heart, Lung, and [place]: https://wilson-eaton.com/  American Heart Association: www.heart.org Summary  The DASH eating plan is a healthy eating plan that has been shown to reduce high blood pressure (hypertension). It may also reduce your risk for type 2 diabetes, heart disease, and stroke.  With the DASH eating plan, you should limit salt (sodium) intake to 2,300 mg a day. If you have hypertension, you may need to reduce your sodium intake to 1,500 mg a day.  When  on the DASH eating plan, aim to eat more fresh fruits and vegetables, whole grains, lean proteins, low-fat dairy, and heart-healthy fats.  Work with your health care provider or diet and nutrition specialist (dietitian) to adjust your eating plan to your individual calorie needs. This information is not intended to replace advice given to you by your health care provider. Make sure you discuss any questions you have with your health care  provider. Document Released: 02/04/2011 Document Revised: 02/09/2016 Document Reviewed: 02/09/2016 Elsevier Interactive Patient Education  2018 [employer].   IF you received an x-ray today, you will receive an invoice from [facility]. Please contact [facility] at [phone] with questions or concerns regarding your invoice.   IF you received labwork today, you will receive an invoice from [facility]. Please contact [facility] at [phone] with questions or concerns regarding your invoice.   Our billing staff will not be able to assist you with questions regarding bills from these companies.  You will be contacted with the lab results as soon as they are available. The fastest way to get your results is to activate your My Chart account. Instructions are located on the last page of this paperwork. If you have not heard from [place] regarding the results in 2 weeks, please contact this office.

## 2017-12-06 DIAGNOSIS — M542 Cervicalgia: Secondary | ICD-10-CM | POA: Diagnosis not present

## 2017-12-14 ENCOUNTER — Telehealth: Payer: Self-pay

## 2017-12-14 NOTE — Telephone Encounter (Signed)
I left a message asking that patient call the office to confirm whether or not he has established care with a new PCP. Pt has been seen by several providers at [facility] at [facility].

## 2017-12-15 NOTE — Telephone Encounter (Signed)
Left message on voicemail for patient to return call when available   

## 2017-12-16 NOTE — Telephone Encounter (Signed)
Left message on voicemail for patient to return call when available   

## 2017-12-19 NOTE — Telephone Encounter (Signed)
Left message on voicemail for patient to return call when available, last attempt.

## 2017-12-21 NOTE — Telephone Encounter (Signed)
He needs to only be seeing one PCP, he can decide.

## 2017-12-21 NOTE — Telephone Encounter (Signed)
Pt has been seeing 2 PCP- please advise if you wish to remain his PCP

## 2017-12-21 NOTE — Telephone Encounter (Signed)
Patient called and left message on Clinical Intake and stated that he is keeping both PCP Providers.   Tried calling patient back to let him know there cannot be 2 PCP's but had to leave message to return call.

## 2017-12-22 DIAGNOSIS — M5412 Radiculopathy, cervical region: Secondary | ICD-10-CM | POA: Insufficient documentation

## 2017-12-24 ENCOUNTER — Other Ambulatory Visit: Payer: Self-pay

## 2017-12-24 ENCOUNTER — Ambulatory Visit (INDEPENDENT_AMBULATORY_CARE_PROVIDER_SITE_OTHER): Payer: BLUE CROSS/BLUE SHIELD | Admitting: Physician Assistant

## 2017-12-24 ENCOUNTER — Encounter: Payer: Self-pay | Admitting: Physician Assistant

## 2017-12-24 VITALS — BP 130/72 | HR 72 | Temp 98.0°F | Ht 71.0 in | Wt 202.2 lb

## 2017-12-24 DIAGNOSIS — I10 Essential (primary) hypertension: Secondary | ICD-10-CM

## 2017-12-24 NOTE — Patient Instructions (Addendum)
  Keep taking meds as you are. Bring bp cuff to next visit. It was a pleasure meeting you.  If you have lab work done today you will be contacted with your lab results within the next 2 weeks.  If you have not heard from [place] then please contact us. The fastest way to get your results is to register for My Chart.   IF you received an x-ray today, you will receive an invoice from [facility]. Please contact [facility] at [phone] with questions or concerns regarding your invoice.   IF you received labwork today, you will receive an invoice from [facility]. Please contact [facility] at [phone] with questions or concerns regarding your invoice.   Our billing staff will not be able to assist you with questions regarding bills from these companies.  You will be contacted with the lab results as soon as they are available. The fastest way to get your results is to activate your My Chart account. Instructions are located on the last page of this paperwork. If you have not heard from [place] regarding the results in 2 weeks, please contact this office.

## 2017-12-24 NOTE — Progress Notes (Signed)
     MRN: [id number] DOB: [birth year]  Subjective:   Antonio Washington is a 67 y.o. male presenting for follow up on hypertension and needs work form completed. Last seen on 12/07/17. Benazpril added to regimen. Currently managed with amlodipine-benazepril 10-20mg . Patient is checking blood pressure at home, range is 117-120s systolic. Just bought a new bp cuff. Denies lightheadedness, dizziness, chronic headache, double vision, chest pain, shortness of breath, heart racing, palpitations, nausea, vomiting, abdominal pain, hematuria, lower leg swelling. Lifestyle: Tries to avoid excessive salt intake. Trying to exercise on a regular basis; just lots of different upper arm exercises every other day. Denies smoking. Denies any other aggravating or relieving factors, no other questions or concerns.  Antonio Washington has a current medication list which includes the following prescription(s): amlodipine-benazepril, atorvastatin, vitamin d, and mupirocin ointment. Also has No Known Allergies.  Antonio Washington  has a past medical history of Asthma and Hyperlipidemia. Also  has no past surgical history on file.   Objective:   Vitals: BP 130/72   Pulse 72   Temp 98 F (36.7 C) (Oral)   Ht 5\' 11"  (1.803 m)   Wt 202 lb 3.2 oz (91.7 kg)   SpO2 96%   BMI 28.20 kg/m   Physical Exam  Constitutional: He is oriented to person, place, and time. He appears well-developed and well-nourished. No distress.  HENT:  Head: Normocephalic and atraumatic.  Mouth/Throat: Uvula is midline, oropharynx is clear and moist and mucous membranes are normal. No tonsillar exudate.  Eyes: Pupils are equal, round, and reactive to light. Conjunctivae and EOM are normal.  Neck: Normal range of motion.  Cardiovascular: Normal rate, regular rhythm, normal heart sounds and intact distal pulses.  Pulmonary/Chest: Effort normal and breath sounds normal. He has no decreased breath sounds. He has no wheezes. He has no rhonchi. He has no rales.    Musculoskeletal:       Right lower leg: He exhibits no swelling.       Left lower leg: He exhibits no swelling.  Neurological: He is alert and oriented to person, place, and time.  Skin: Skin is warm and dry.  Psychiatric: He has a normal mood and affect.  Vitals reviewed.   No results found for this or any previous visit (from the past 24 hour(s)).  Assessment and Plan :  1. Essential hypertension BP well controlled today. Home readings are wnl. Work work completed, scanned, and returned to pt. Rec cont meds as he is. Follow up when he needs refills. Bring bp cuff to that visit so we can compare to our in office readings.    Benjiman Core, PA-C  [facility] at [facility] 12/24/2017 11:00 AM

## 2017-12-26 NOTE — Telephone Encounter (Signed)
Left message on voicemail for patient to return call when available   

## 2017-12-27 DIAGNOSIS — M5412 Radiculopathy, cervical region: Secondary | ICD-10-CM | POA: Diagnosis not present

## 2017-12-27 NOTE — Telephone Encounter (Signed)
Left message on voicemail for patient to return call when available   

## 2017-12-27 NOTE — Telephone Encounter (Signed)
I spoke with [facility] at [facility] to see if patient had officially established care with one of their providers and I was told that patient had not officially established a PCP with their office. He has been seen for multiple reasons but it would be patient's choice as to whether he switched his care from our office to theirs. I was told that they could not make that choice for the patient.

## 2017-12-28 ENCOUNTER — Ambulatory Visit: Payer: Self-pay | Admitting: Podiatry

## 2017-12-29 DIAGNOSIS — M5412 Radiculopathy, cervical region: Secondary | ICD-10-CM | POA: Diagnosis not present

## 2018-01-03 DIAGNOSIS — M5412 Radiculopathy, cervical region: Secondary | ICD-10-CM | POA: Diagnosis not present

## 2018-01-05 DIAGNOSIS — M5412 Radiculopathy, cervical region: Secondary | ICD-10-CM | POA: Diagnosis not present

## 2018-01-10 DIAGNOSIS — M5412 Radiculopathy, cervical region: Secondary | ICD-10-CM | POA: Diagnosis not present

## 2018-01-12 DIAGNOSIS — M5412 Radiculopathy, cervical region: Secondary | ICD-10-CM | POA: Diagnosis not present

## 2018-01-17 DIAGNOSIS — M5412 Radiculopathy, cervical region: Secondary | ICD-10-CM | POA: Diagnosis not present

## 2018-01-19 DIAGNOSIS — M5412 Radiculopathy, cervical region: Secondary | ICD-10-CM | POA: Diagnosis not present

## 2018-03-06 DIAGNOSIS — M5412 Radiculopathy, cervical region: Secondary | ICD-10-CM | POA: Diagnosis not present

## 2018-05-30 ENCOUNTER — Other Ambulatory Visit: Payer: Self-pay

## 2018-05-30 ENCOUNTER — Encounter (INDEPENDENT_AMBULATORY_CARE_PROVIDER_SITE_OTHER): Payer: BLUE CROSS/BLUE SHIELD | Admitting: Family Medicine

## 2018-05-30 DIAGNOSIS — Z91199 Patient's noncompliance with other medical treatment and regimen due to unspecified reason: Secondary | ICD-10-CM

## 2018-05-30 DIAGNOSIS — Z5329 Procedure and treatment not carried out because of patient's decision for other reasons: Secondary | ICD-10-CM

## 2018-05-30 NOTE — Patient Instructions (Signed)
° ° ° °  If you have lab work done today you will be contacted with your lab results within the next 2 weeks.  If you have not heard from us then please contact us. The fastest way to get your results is to register for My Chart. ° ° °IF you received an x-ray today, you will receive an invoice from [facility]. Please contact [facility] at [phone] with questions or concerns regarding your invoice.  ° °IF you received labwork today, you will receive an invoice from [facility]. Please contact [facility] at [phone] with questions or concerns regarding your invoice.  ° °Our billing staff will not be able to assist you with questions regarding bills from these companies. ° °You will be contacted with the lab results as soon as they are available. The fastest way to get your results is to activate your My Chart account. Instructions are located on the last page of this paperwork. If you have not heard from us regarding the results in 2 weeks, please contact this office. °  ° ° ° °

## 2018-05-30 NOTE — Progress Notes (Signed)
No show  This encounter was created in error - please disregard.

## 2018-05-30 NOTE — Progress Notes (Signed)
CC: transfer of care.  Pt has concerns about covid-19 and wants to be tested.  I advised pt he can only be tested for covid-19 if he has been exposed to virus, symptomatic with fever, sob, cough and sneezing.  Pt denies all these symptoms but says he thinks he may have a stomach virus because he feels nauseous on the stomach like he wants to vomit for a few minutes then it goes away.

## 2018-06-07 ENCOUNTER — Telehealth: Payer: Self-pay

## 2018-06-07 ENCOUNTER — Other Ambulatory Visit: Payer: Self-pay

## 2018-06-07 ENCOUNTER — Encounter: Payer: BLUE CROSS/BLUE SHIELD | Admitting: Nurse Practitioner

## 2018-06-07 NOTE — Telephone Encounter (Signed)
This service is provided via telemedicine      Unable to reach patient to complete E-Visit First attempt at 11:30a no answer- left message on machine. Second attempt at 11:44a no answer left message on machine, third attempt made to patient with no answer.

## 2018-06-07 NOTE — Progress Notes (Signed)
This encounter was created in error - please disregard.

## 2018-09-28 DIAGNOSIS — L600 Ingrowing nail: Secondary | ICD-10-CM | POA: Diagnosis not present

## 2018-09-28 DIAGNOSIS — B353 Tinea pedis: Secondary | ICD-10-CM | POA: Diagnosis not present

## 2018-09-28 DIAGNOSIS — M19071 Primary osteoarthritis, right ankle and foot: Secondary | ICD-10-CM | POA: Diagnosis not present

## 2018-09-28 DIAGNOSIS — B351 Tinea unguium: Secondary | ICD-10-CM | POA: Diagnosis not present

## 2018-09-28 DIAGNOSIS — M205X2 Other deformities of toe(s) (acquired), left foot: Secondary | ICD-10-CM | POA: Diagnosis not present

## 2018-09-28 DIAGNOSIS — M21611 Bunion of right foot: Secondary | ICD-10-CM | POA: Diagnosis not present

## 2018-09-28 DIAGNOSIS — M19072 Primary osteoarthritis, left ankle and foot: Secondary | ICD-10-CM | POA: Diagnosis not present

## 2018-09-28 DIAGNOSIS — I739 Peripheral vascular disease, unspecified: Secondary | ICD-10-CM | POA: Diagnosis not present

## 2018-10-06 DIAGNOSIS — B351 Tinea unguium: Secondary | ICD-10-CM | POA: Diagnosis not present

## 2018-10-24 ENCOUNTER — Telehealth: Payer: Self-pay | Admitting: Nurse Practitioner

## 2018-10-24 NOTE — Telephone Encounter (Signed)
Left voice message to try to confirm if this patient is still our patient - has not been seen since 2018.  Needs appt. For follow up and AWV

## 2018-10-31 DIAGNOSIS — I10 Essential (primary) hypertension: Secondary | ICD-10-CM | POA: Diagnosis not present

## 2018-10-31 DIAGNOSIS — H1132 Conjunctival hemorrhage, left eye: Secondary | ICD-10-CM | POA: Diagnosis not present

## 2018-11-02 DIAGNOSIS — H1132 Conjunctival hemorrhage, left eye: Secondary | ICD-10-CM | POA: Diagnosis not present

## 2018-11-02 DIAGNOSIS — H2513 Age-related nuclear cataract, bilateral: Secondary | ICD-10-CM | POA: Diagnosis not present

## 2018-11-21 DIAGNOSIS — M21611 Bunion of right foot: Secondary | ICD-10-CM | POA: Diagnosis not present

## 2018-11-21 DIAGNOSIS — B353 Tinea pedis: Secondary | ICD-10-CM | POA: Diagnosis not present

## 2018-11-23 DIAGNOSIS — H25812 Combined forms of age-related cataract, left eye: Secondary | ICD-10-CM | POA: Diagnosis not present

## 2018-11-23 DIAGNOSIS — H2511 Age-related nuclear cataract, right eye: Secondary | ICD-10-CM | POA: Diagnosis not present

## 2018-11-23 DIAGNOSIS — H1132 Conjunctival hemorrhage, left eye: Secondary | ICD-10-CM | POA: Diagnosis not present

## 2018-12-12 ENCOUNTER — Encounter: Payer: BLUE CROSS/BLUE SHIELD | Admitting: Emergency Medicine

## 2019-01-30 ENCOUNTER — Encounter: Payer: Self-pay | Admitting: Family Medicine

## 2019-01-30 ENCOUNTER — Ambulatory Visit (INDEPENDENT_AMBULATORY_CARE_PROVIDER_SITE_OTHER): Payer: BC Managed Care – PPO | Admitting: Family Medicine

## 2019-01-30 ENCOUNTER — Other Ambulatory Visit: Payer: Self-pay

## 2019-01-30 ENCOUNTER — Other Ambulatory Visit (HOSPITAL_COMMUNITY)
Admission: RE | Admit: 2019-01-30 | Discharge: 2019-01-30 | Disposition: A | Discharge status: Home / Self Care | Payer: BC Managed Care – PPO | Source: Ambulatory Visit | Attending: Family Medicine | Admitting: Family Medicine

## 2019-01-30 VITALS — BP 134/78 | HR 77 | Temp 98.1°F | Resp 17 | Ht 71.0 in | Wt 206.0 lb

## 2019-01-30 DIAGNOSIS — Z113 Encounter for screening for infections with a predominantly sexual mode of transmission: Secondary | ICD-10-CM

## 2019-01-30 DIAGNOSIS — Z0000 Encounter for general adult medical examination without abnormal findings: Secondary | ICD-10-CM

## 2019-01-30 DIAGNOSIS — E782 Mixed hyperlipidemia: Secondary | ICD-10-CM | POA: Diagnosis not present

## 2019-01-30 DIAGNOSIS — I10 Essential (primary) hypertension: Secondary | ICD-10-CM

## 2019-01-30 DIAGNOSIS — Z1159 Encounter for screening for other viral diseases: Secondary | ICD-10-CM | POA: Diagnosis not present

## 2019-01-30 DIAGNOSIS — Z8042 Family history of malignant neoplasm of prostate: Secondary | ICD-10-CM | POA: Diagnosis not present

## 2019-01-30 DIAGNOSIS — R7303 Prediabetes: Secondary | ICD-10-CM | POA: Diagnosis not present

## 2019-01-30 DIAGNOSIS — R399 Unspecified symptoms and signs involving the genitourinary system: Secondary | ICD-10-CM | POA: Diagnosis not present

## 2019-01-30 DIAGNOSIS — Z0001 Encounter for general adult medical examination with abnormal findings: Secondary | ICD-10-CM | POA: Diagnosis not present

## 2019-01-30 LAB — POCT URINALYSIS DIP (MANUAL ENTRY)
Blood, UA: NEGATIVE
Glucose, UA: NEGATIVE mg/dL
Leukocytes, UA: NEGATIVE
Nitrite, UA: NEGATIVE
Protein Ur, POC: NEGATIVE mg/dL
Spec Grav, UA: >=1.03 — AB (ref 1.010–1.025)
Urobilinogen, UA: 1 E.U./dL
pH, UA: 5.5 (ref 5.0–8.0)

## 2019-01-30 LAB — POCT GLYCOSYLATED HEMOGLOBIN (HGB A1C): Hemoglobin A1C: 5.9 % — AB (ref 4.0–5.6)

## 2019-01-30 MED ORDER — ATORVASTATIN CALCIUM 20 MG PO TABS: 20.0000 mg | ORAL_TABLET | Freq: Every day | ORAL | 3 refills | Status: AC

## 2019-01-30 MED ORDER — AMLODIPINE BESY-BENAZEPRIL HCL 10-20 MG PO CAPS: 1.0000 | ORAL_CAPSULE | Freq: Every day | ORAL | 1 refills | Status: DC

## 2019-01-30 NOTE — Progress Notes (Signed)
Chief Complaint  Patient presents with   Annual Exam    cpe.  pt has concerns about fatigue and frequent urination.  Urine specimen collected.   Medication Refill    lotrel, lipitor and vitamin D    Subjective:  Antonio Washington is a 68 y.o. male here for a health maintenance visit.  Patient is established pt   Patient reports that he has some incontinence of urine over the past 4 months He also has increased urgency He denies hesistancy He denies weak stream He denies any impotence or erectile dysfunction  Hypertension: Patient here for follow-up of elevated blood pressure. He is exercising and is adherent to low salt diet.  Blood pressure is well controlled at home. Cardiac symptoms none. Patient denies chest pain, chest pressure/discomfort, claudication, dyspnea and exertional chest pressure/discomfort.  Cardiovascular risk factors: dyslipidemia and hypertension. Use of agents associated with hypertension: none. History of target organ damage: none. BP Readings from Last 3 Encounters:  01/30/19 134/78  12/24/17 130/72  12/02/17 (!) 150/83      Patient Active Problem List   Diagnosis Date Noted   Cervical radiculopathy 12/22/2017   Chest pain at rest 12/24/2012   Acute renal insufficiency 12/24/2012   FATIGUE, ACUTE 07/24/2009    Past Medical History:  Diagnosis Date   Asthma    childhood   Hyperlipidemia     No past surgical history on file.   Outpatient Medications Prior to Visit  Medication Sig Dispense Refill   amLODipine-benazepril (LOTREL) 10-20 MG capsule Take 1 capsule by mouth daily. 90 capsule 1   atorvastatin (LIPITOR) 20 MG tablet Take 1 tablet (20 mg total) by mouth daily. 90 tablet 3   Cholecalciferol (VITAMIN D) 2000 units tablet Take 1 tablet (2,000 Units total) by mouth daily. 90 tablet 1   mupirocin ointment (BACTROBAN) 2 % Apply 1 application topically 3 (three) times daily. (Patient not taking: Reported on 05/30/2018) 30 g 0   No  facility-administered medications prior to visit.     No Known Allergies   Family History  Problem Relation Age of Onset   Cancer Father      Health Habits: Dental Exam: up to date Eye Exam: up to date Exercise: 3 times/week on average Current exercise activities: walking/running Diet: balanced  Social History   Socioeconomic History   Marital status: Single    Spouse name: Not on file   Number of children: 2   Years of education: Not on file   Highest education level: Not on file  Occupational History   Not on file  Social Needs   Financial resource strain: Not on file   Food insecurity    Worry: Not on file    Inability: Not on file   Transportation needs    Medical: Not on file    Non-medical: Not on file  Tobacco Use   Smoking status: Never Smoker   Smokeless tobacco: Never Used  Substance and Sexual Activity   Alcohol use: No    Alcohol/week: 0.0 standard drinks   Drug use: No   Sexual activity: Yes  Lifestyle   Physical activity    Days per week: Not on file    Minutes per session: Not on file   Stress: Not on file  Relationships   Social connections    Talks on phone: Not on file    Gets together: Not on file    Attends religious service: Not on file    Active member of club  or organization: Not on file    Attends meetings of clubs or organizations: Not on file    Relationship status: Not on file   Intimate partner violence    Fear of current or ex partner: Not on file    Emotionally abused: Not on file    Physically abused: Not on file    Forced sexual activity: Not on file  Other Topics Concern   Not on file  Social History Narrative   Diet:      Do you drink/ eat things with caffeine? No       Marital status:    No                           What year were you married ?      Do you live in a house, apartment,assistred living, condo, trailer, etc.)? House      Is it one or more stories? 2      How many persons live  in your home ? 1      Do you have any pets in your home ?(please list) No      Current or past profession: Midwife       Do you exercise?  At Times                            Type & how often: Run-Run      Do you have a living will? No      Do you have a DNR form? No                      If not, do you want to discuss one? Yes      Do you have signed POA?HPOA forms?  No               If so, please bring to your        appointment      Social History   Substance and Sexual Activity  Alcohol Use No   Alcohol/week: 0.0 standard drinks   Social History   Tobacco Use  Smoking Status Never Smoker  Smokeless Tobacco Never Used   Social History   Substance and Sexual Activity  Drug Use No      Health Maintenance: See under health Maintenance activity for review of completion dates as well. Immunization History  Administered Date(s) Administered   Influenza, High Dose Seasonal PF 02/03/2017   Influenza-Unspecified 03/01/2016   Pneumococcal Conjugate-13 07/29/2016, 11/03/2017   Td 06/30/2006      Depression Screen-PHQ2/9 Depression screen [facility] 2/9 01/30/2019 12/24/2017 12/02/2017 11/03/2017 07/29/2016  Decreased Interest 0 0 0 0 0  Down, Depressed, Hopeless 0 0 0 0 0  PHQ - 2 Score 0 0 0 0 0       Depression Severity and Treatment Recommendations:  0-4= None  5-9= Mild / Treatment: Support, educate to call if worse; return in one month  10-14= Moderate / Treatment: Support, watchful waiting; Antidepressant or Psycotherapy  15-19= Moderately severe / Treatment: Antidepressant OR Psychotherapy  >= 20 = Major depression, severe / Antidepressant AND Psychotherapy    Review of Systems   ROS  See HPI for ROS as well.    Objective:   Vitals:   01/30/19 0953  BP: 134/78  Pulse: 77  Resp: 17  Temp: 98.1 F (36.7 C)  TempSrc: Oral  SpO2: 97%  Weight: 206 lb (93.4 kg)  Height:  (1.803 m)    Body mass index is 28.73 kg/m.  Physical  Exam  BP 134/78 (BP Location: Right Arm, Patient Position: Sitting, Cuff Size: Large)    Pulse 77    Temp 98.1 F (36.7 C) (Oral)    Resp 17    Ht  (1.803 m)    Wt 206 lb (93.4 kg)    SpO2 97%    BMI 28.73 kg/m   General Appearance:    Alert, cooperative, no distress, appears stated age  Head:    Normocephalic, without obvious abnormality, atraumatic  Eyes:    PERRL, conjunctiva/corneas clear, EOM's intact  Ears:    Normal TM's and external ear canals, both ears  Nose:   Nares normal, septum midline, mucosa normal, no drainage   or sinus tenderness  Throat:   Lips, mucosa, and tongue normal; teeth and gums normal  Neck:   Supple, symmetrical, trachea midline, no adenopathy;       thyroid:  No enlargement/tenderness/nodules  Back:     Symmetric, no curvature, ROM normal, no CVA tenderness  Lungs:     Clear to auscultation bilaterally, respirations unlabored  Chest wall:    No tenderness or deformity  Heart:    Regular rate and rhythm, S1 and S2 normal, no murmur, rub   or gallop  Abdomen:     Soft, non-tender, bowel sounds active all four quadrants,    no masses, no organomegaly  Genitalia:    Referral placed for Urology  Extremities:   Extremities normal, atraumatic, no cyanosis or edema  Pulses:   2+ and symmetric all extremities  Skin:   Skin color, texture, turgor normal, no rashes or lesions  Lymph nodes:   Cervical, supraclavicular, and axillary nodes normal  Neurologic:   CNII-XII intact. Normal strength, sensation and reflexes      throughout      Assessment/Plan:   Patient was seen for a health maintenance exam.  Counseled the patient on health maintenance issues. Reviewed her health mainteance schedule and ordered appropriate tests (see orders.) Counseled on regular exercise and weight management. Recommend regular eye exams and dental cleaning.   The following issues were addressed today for health maintenance:   Sly was seen today for annual exam and  medication refill.  Diagnoses and all orders for this visit:  Annual physical exam- discussed health screening based on age and ethnicity  Essential hypertension- Patient's blood pressure is at goal of 139/89 or less. Condition is stable. Continue current medications and treatment plan. I recommend that you exercise for 30-45 minutes 5 days a week. I also recommend a balanced diet with fruits and vegetables every day, lean meats, and little fried foods. The DASH diet (you can find this online) is a good example of this.  -     Comprehensive metabolic panel -     Lipid panel  Encounter for hepatitis C screening test for low risk patient -     Hep C ab with reflex  Family history of prostate cancer in father -     Ambulatory referral to Urology -     PSA  Mixed hyperlipidemia- discussed lipid mgmt, his cholesterol was controlled last check Continue lipitor -     Comprehensive metabolic panel -     Lipid panel  Screen for STD (sexually transmitted disease) -     GC/Chlamydia probe amp ([facility])not at [facility] -  Hepatitis B surface antigen -     HIV antibody -     RPR  Lower urinary tract symptoms (LUTS) - discussed urology to follow up -     POCT urine dipstick -     Ambulatory referral to Urology -     PSA  Prediabetes-  Reviewed previous a1c Discussed diabetes prevention  -     POCT glycosylated hemoglobin (Hb A1C)    No follow-ups on file.    Body mass index is 28.73 kg/m.:  Discussed the patient's BMI with patient. The BMI body mass index is 28.73 kg/m.     No future appointments.  Patient Instructions    Cut back to only the essential supplements Continue black seed oil, fish oil and tumeric Only take vitamin C if you have not eaten any greens Add on vitamin D supplement a few times a week Continue your other meds as prescribed   If you have lab work done today you will be contacted with your lab results within the next 2 weeks.  If you have not  heard from us then please contact us. The fastest way to get your results is to register for My Chart.   IF you received an x-ray today, you will receive an invoice from [facility]. Please contact [facility] at [phone] with questions or concerns regarding your invoice.   IF you received labwork today, you will receive an invoice from [facility]. Please contact [facility] at [phone] with questions or concerns regarding your invoice.   Our billing staff will not be able to assist you with questions regarding bills from these companies.  You will be contacted with the lab results as soon as they are available. The fastest way to get your results is to activate your My Chart account. Instructions are located on the last page of this paperwork. If you have not heard from us regarding the results in 2 weeks, please contact this office.     Benign Prostatic Hyperplasia  Benign prostatic hyperplasia (BPH) is an enlarged prostate gland that is caused by the normal aging process and not by cancer. The prostate is a walnut-sized gland that is involved in the production of semen. It is located in front of the rectum and below the bladder. The bladder stores urine and the urethra is the tube that carries the urine out of the body. The prostate may get bigger as a man gets older. An enlarged prostate can press on the urethra. This can make it harder to pass urine. The build-up of urine in the bladder can cause infection. Back pressure and infection may progress to bladder damage and kidney (renal) failure. What are the causes? This condition is part of a normal aging process. However, not all men develop problems from this condition. If the prostate enlarges away from the urethra, urine flow will not be blocked. If it enlarges toward the urethra and compresses it, there will be problems passing urine. What increases the risk? This condition is more likely to develop in men over the  age of 50 years. What are the signs or symptoms? Symptoms of this condition include:  Getting up often during the night to urinate.  Needing to urinate frequently during the day.  Difficulty starting urine flow.  Decrease in size and strength of your urine stream.  Leaking (dribbling) after urinating.  Inability to pass urine. This needs immediate treatment.  Inability to completely empty your bladder.  Pain when you pass urine. This  is more common if there is also an infection.  Urinary tract infection (UTI). How is this diagnosed? This condition is diagnosed based on your medical history, a physical exam, and your symptoms. Tests will also be done, such as:  A post-void bladder scan. This measures any amount of urine that may remain in your bladder after you finish urinating.  A digital rectal exam. In a rectal exam, your health care provider checks your prostate by putting a lubricated, gloved finger into your rectum to feel the back of your prostate gland. This exam detects the size of your gland and any abnormal lumps or growths.  An exam of your urine (urinalysis).  A prostate specific antigen (PSA) screening. This is a blood test used to screen for prostate cancer.  An ultrasound. This test uses sound waves to electronically produce a picture of your prostate gland. Your health care provider may refer you to a specialist in kidney and prostate diseases (urologist). How is this treated? Once symptoms begin, your health care provider will monitor your condition (active surveillance or watchful waiting). Treatment for this condition will depend on the severity of your condition. Treatment may include:  Observation and yearly exams. This may be the only treatment needed if your condition and symptoms are mild.  Medicines to relieve your symptoms, including: ? Medicines to shrink the prostate. ? Medicines to relax the muscle of the prostate.  Surgery in severe cases.  Surgery may include: ? Prostatectomy. In this procedure, the prostate tissue is removed completely through an open incision or with a laparoscope or robotics. ? Transurethral resection of the prostate (TURP). In this procedure, a tool is inserted through the opening at the tip of the penis (urethra). It is used to cut away tissue of the inner core of the prostate. The pieces are removed through the same opening of the penis. This removes the blockage. ? Transurethral incision (TUIP). In this procedure, small cuts are made in the prostate. This lessens the prostate's pressure on the urethra. ? Transurethral microwave thermotherapy (TUMT). This procedure uses microwaves to create heat. The heat destroys and removes a small amount of prostate tissue. ? Transurethral needle ablation (TUNA). This procedure uses radio frequencies to destroy and remove a small amount of prostate tissue. ? Interstitial laser coagulation (ILC). This procedure uses a laser to destroy and remove a small amount of prostate tissue. ? Transurethral electrovaporization (TUVP). This procedure uses electrodes to destroy and remove a small amount of prostate tissue. ? Prostatic urethral lift. This procedure inserts an implant to push the lobes of the prostate away from the urethra. Follow these instructions at home:  Take over-the-counter and prescription medicines only as told by your health care provider.  Monitor your symptoms for any changes. Contact your health care provider with any changes.  Avoid drinking large amounts of liquid before going to bed or out in public.  Avoid or reduce how much caffeine or alcohol you drink.  Give yourself time when you urinate.  Keep all follow-up visits as told by your health care provider. This is important. Contact a health care provider if:  You have unexplained back pain.  Your symptoms do not get better with treatment.  You develop side effects from the medicine you are  taking.  Your urine becomes very dark or has a bad smell.  Your lower abdomen becomes distended and you have trouble passing your urine. Get help right away if:  You have a fever or chills.  You suddenly cannot urinate.  You feel lightheaded, or very dizzy, or you faint.  There are large amounts of blood or clots in the urine.  Your urinary problems become hard to manage.  You develop moderate to severe low back or flank pain. The flank is the side of your body between the ribs and the hip. These symptoms may represent a serious problem that is an emergency. Do not wait to see if the symptoms will go away. Get medical help right away. Call your local emergency services (911 in the [place] Do not drive yourself to the hospital. Summary  Benign prostatic hyperplasia (BPH) is an enlarged prostate that is caused by the normal aging process and not by cancer.  An enlarged prostate can press on the urethra. This can make it hard to pass urine.  This condition is part of a normal aging process and is more likely to develop in men over the age of 50 years.  Get help right away if you suddenly cannot urinate. This information is not intended to replace advice given to you by your health care provider. Make sure you discuss any questions you have with your health care provider. Document Released: 02/15/2005 Document Revised: 01/10/2018 Document Reviewed: 03/22/2016 Elsevier Patient Education  2020 [employer].

## 2019-01-30 NOTE — Patient Instructions (Addendum)
Cut back to only the essential supplements Continue black seed oil, fish oil and tumeric Only take vitamin C if you have not eaten any greens Add on vitamin D supplement a few times a week Continue your other meds as prescribed   If you have lab work done today you will be contacted with your lab results within the next 2 weeks.  If you have not heard from us then please contact us. The fastest way to get your results is to register for My Chart.   IF you received an x-ray today, you will receive an invoice from [facility]. Please contact [facility] at [phone] with questions or concerns regarding your invoice.   IF you received labwork today, you will receive an invoice from [facility]. Please contact [facility] at [phone] with questions or concerns regarding your invoice.   Our billing staff will not be able to assist you with questions regarding bills from these companies.  You will be contacted with the lab results as soon as they are available. The fastest way to get your results is to activate your My Chart account. Instructions are located on the last page of this paperwork. If you have not heard from us regarding the results in 2 weeks, please contact this office.     Benign Prostatic Hyperplasia  Benign prostatic hyperplasia (BPH) is an enlarged prostate gland that is caused by the normal aging process and not by cancer. The prostate is a walnut-sized gland that is involved in the production of semen. It is located in front of the rectum and below the bladder. The bladder stores urine and the urethra is the tube that carries the urine out of the body. The prostate may get bigger as a man gets older. An enlarged prostate can press on the urethra. This can make it harder to pass urine. The build-up of urine in the bladder can cause infection. Back pressure and infection may progress to bladder damage and kidney (renal) failure. What are the causes? This  condition is part of a normal aging process. However, not all men develop problems from this condition. If the prostate enlarges away from the urethra, urine flow will not be blocked. If it enlarges toward the urethra and compresses it, there will be problems passing urine. What increases the risk? This condition is more likely to develop in men over the age of 50 years. What are the signs or symptoms? Symptoms of this condition include:  Getting up often during the night to urinate.  Needing to urinate frequently during the day.  Difficulty starting urine flow.  Decrease in size and strength of your urine stream.  Leaking (dribbling) after urinating.  Inability to pass urine. This needs immediate treatment.  Inability to completely empty your bladder.  Pain when you pass urine. This is more common if there is also an infection.  Urinary tract infection (UTI). How is this diagnosed? This condition is diagnosed based on your medical history, a physical exam, and your symptoms. Tests will also be done, such as:  A post-void bladder scan. This measures any amount of urine that may remain in your bladder after you finish urinating.  A digital rectal exam. In a rectal exam, your health care provider checks your prostate by putting a lubricated, gloved finger into your rectum to feel the back of your prostate gland. This exam detects the size of your gland and any abnormal lumps or growths.  An exam of your urine (urinalysis).  A  prostate specific antigen (PSA) screening. This is a blood test used to screen for prostate cancer.  An ultrasound. This test uses sound waves to electronically produce a picture of your prostate gland. Your health care provider may refer you to a specialist in kidney and prostate diseases (urologist). How is this treated? Once symptoms begin, your health care provider will monitor your condition (active surveillance or watchful waiting). Treatment for this  condition will depend on the severity of your condition. Treatment may include:  Observation and yearly exams. This may be the only treatment needed if your condition and symptoms are mild.  Medicines to relieve your symptoms, including: ? Medicines to shrink the prostate. ? Medicines to relax the muscle of the prostate.  Surgery in severe cases. Surgery may include: ? Prostatectomy. In this procedure, the prostate tissue is removed completely through an open incision or with a laparoscope or robotics. ? Transurethral resection of the prostate (TURP). In this procedure, a tool is inserted through the opening at the tip of the penis (urethra). It is used to cut away tissue of the inner core of the prostate. The pieces are removed through the same opening of the penis. This removes the blockage. ? Transurethral incision (TUIP). In this procedure, small cuts are made in the prostate. This lessens the prostate's pressure on the urethra. ? Transurethral microwave thermotherapy (TUMT). This procedure uses microwaves to create heat. The heat destroys and removes a small amount of prostate tissue. ? Transurethral needle ablation (TUNA). This procedure uses radio frequencies to destroy and remove a small amount of prostate tissue. ? Interstitial laser coagulation (ILC). This procedure uses a laser to destroy and remove a small amount of prostate tissue. ? Transurethral electrovaporization (TUVP). This procedure uses electrodes to destroy and remove a small amount of prostate tissue. ? Prostatic urethral lift. This procedure inserts an implant to push the lobes of the prostate away from the urethra. Follow these instructions at home:  Take over-the-counter and prescription medicines only as told by your health care provider.  Monitor your symptoms for any changes. Contact your health care provider with any changes.  Avoid drinking large amounts of liquid before going to bed or out in public.  Avoid or  reduce how much caffeine or alcohol you drink.  Give yourself time when you urinate.  Keep all follow-up visits as told by your health care provider. This is important. Contact a health care provider if:  You have unexplained back pain.  Your symptoms do not get better with treatment.  You develop side effects from the medicine you are taking.  Your urine becomes very dark or has a bad smell.  Your lower abdomen becomes distended and you have trouble passing your urine. Get help right away if:  You have a fever or chills.  You suddenly cannot urinate.  You feel lightheaded, or very dizzy, or you faint.  There are large amounts of blood or clots in the urine.  Your urinary problems become hard to manage.  You develop moderate to severe low back or flank pain. The flank is the side of your body between the ribs and the hip. These symptoms may represent a serious problem that is an emergency. Do not wait to see if the symptoms will go away. Get medical help right away. Call your local emergency services (911 in the [place] Do not drive yourself to the hospital. Summary  Benign prostatic hyperplasia (BPH) is an enlarged prostate that is caused by the  normal aging process and not by cancer.  An enlarged prostate can press on the urethra. This can make it hard to pass urine.  This condition is part of a normal aging process and is more likely to develop in men over the age of 44 years.  Get help right away if you suddenly cannot urinate. This information is not intended to replace advice given to you by your health care provider. Make sure you discuss any questions you have with your health care provider. Document Released: 02/15/2005 Document Revised: 01/10/2018 Document Reviewed: 03/22/2016 Elsevier Patient Education  2020 [employer].

## 2019-01-31 LAB — HCV INTERPRETATION

## 2019-01-31 LAB — HEPATITIS B SURFACE ANTIGEN: Hepatitis B Surface Ag: NEGATIVE

## 2019-01-31 LAB — HCV AB W REFLEX TO QUANT PCR: HCV Ab: <0.1 s/co ratio (ref 0.0–0.9)

## 2019-01-31 LAB — LIPID PANEL
Chol/HDL Ratio: 4.3 ratio (ref 0.0–5.0)
Cholesterol, Total: 186 mg/dL (ref 100–199)
HDL: 43 mg/dL (ref >39)
LDL Chol Calc (NIH): 118 mg/dL — ABNORMAL HIGH (ref 0–99)
Triglycerides: 138 mg/dL (ref 0–149)
VLDL Cholesterol Cal: 25 mg/dL (ref 5–40)

## 2019-01-31 LAB — COMPREHENSIVE METABOLIC PANEL
ALT: 24 IU/L (ref 0–44)
AST: 15 IU/L (ref 0–40)
Albumin/Globulin Ratio: 1.8 (ref 1.2–2.2)
Albumin: 4.6 g/dL (ref 3.8–4.8)
Alkaline Phosphatase: 55 IU/L (ref 39–117)
BUN/Creatinine Ratio: 10 (ref 10–24)
BUN: 13 mg/dL (ref 8–27)
Bilirubin Total: 0.8 mg/dL (ref 0.0–1.2)
CO2: 21 mmol/L (ref 20–29)
Calcium: 9.5 mg/dL (ref 8.6–10.2)
Chloride: 105 mmol/L (ref 96–106)
Creatinine, Ser: 1.31 mg/dL — ABNORMAL HIGH (ref 0.76–1.27)
GFR calc Af Amer: 64 mL/min/{1.73_m2} (ref >59)
GFR calc non Af Amer: 56 mL/min/{1.73_m2} — ABNORMAL LOW (ref >59)
Globulin, Total: 2.6 g/dL (ref 1.5–4.5)
Glucose: 104 mg/dL — ABNORMAL HIGH (ref 65–99)
Potassium: 4.4 mmol/L (ref 3.5–5.2)
Sodium: 139 mmol/L (ref 134–144)
Total Protein: 7.2 g/dL (ref 6.0–8.5)

## 2019-01-31 LAB — GC/CHLAMYDIA PROBE AMP (~~LOC~~) NOT AT ARMC
Chlamydia: NEGATIVE
Comment: NEGATIVE
Comment: NORMAL
Neisseria Gonorrhea: NEGATIVE

## 2019-01-31 LAB — RPR: RPR Ser Ql: NONREACTIVE

## 2019-01-31 LAB — HIV ANTIBODY (ROUTINE TESTING W REFLEX): HIV Screen 4th Generation wRfx: NONREACTIVE

## 2019-01-31 LAB — PSA: Prostate Specific Ag, Serum: 1.1 ng/mL (ref 0.0–4.0)

## 2019-03-29 DIAGNOSIS — L84 Corns and callosities: Secondary | ICD-10-CM | POA: Diagnosis not present

## 2019-03-29 DIAGNOSIS — B353 Tinea pedis: Secondary | ICD-10-CM | POA: Diagnosis not present

## 2019-04-18 DIAGNOSIS — R350 Frequency of micturition: Secondary | ICD-10-CM | POA: Diagnosis not present

## 2019-04-18 DIAGNOSIS — N401 Enlarged prostate with lower urinary tract symptoms: Secondary | ICD-10-CM | POA: Diagnosis not present

## 2019-04-18 DIAGNOSIS — R3912 Poor urinary stream: Secondary | ICD-10-CM | POA: Diagnosis not present

## 2019-05-11 ENCOUNTER — Telehealth: Payer: Self-pay | Admitting: Family Medicine

## 2019-05-11 MED ORDER — VITAMIN D 50 MCG (2000 UT) PO TABS: 2000.0000 [IU] | ORAL_TABLET | Freq: Every day | ORAL | 2 refills | Status: AC

## 2019-05-11 NOTE — Telephone Encounter (Signed)
Rx has been sent  

## 2019-05-11 NOTE — Telephone Encounter (Signed)
What is the name of the medication? Cholecalciferol (VITAMIN D) 2000 units tablet    Have you contacted your pharmacy to request a refill? N   Which pharmacy would you like this sent to? [facility] [phone] - [place], [place] - [address] AT [facility] [address]  [address] [place] [address]  Phone:  [phone] Fax:  [phone]     Patient notified that their request is being sent to the clinical staff for review and that they should receive a call once it is complete. If they do not receive a call within 72 hours they can check with their pharmacy or our office.

## 2019-06-05 DIAGNOSIS — N401 Enlarged prostate with lower urinary tract symptoms: Secondary | ICD-10-CM | POA: Diagnosis not present

## 2019-06-05 DIAGNOSIS — R350 Frequency of micturition: Secondary | ICD-10-CM | POA: Diagnosis not present

## 2019-06-08 ENCOUNTER — Other Ambulatory Visit: Payer: Self-pay

## 2019-06-08 ENCOUNTER — Ambulatory Visit (INDEPENDENT_AMBULATORY_CARE_PROVIDER_SITE_OTHER): Payer: Self-pay | Admitting: Family Medicine

## 2019-06-08 ENCOUNTER — Encounter: Payer: Self-pay | Admitting: Family Medicine

## 2019-06-08 VITALS — BP 125/82 | HR 81 | Temp 98.5°F | Ht 72.0 in | Wt 207.0 lb

## 2019-06-08 DIAGNOSIS — Z024 Encounter for examination for driving license: Secondary | ICD-10-CM

## 2019-06-08 NOTE — Patient Instructions (Addendum)
     If you have lab work done today you will be contacted with your lab results within the next 2 weeks.  If you have not heard from [place] then please contact us. The fastest way to get your results is to register for My Chart.   IF you received an x-ray today, you will receive an invoice from [facility]. Please contact [facility] at [phone] with questions or concerns regarding your invoice.   IF you received labwork today, you will receive an invoice from [place]. Please contact [facility] at [phone] with questions or concerns regarding your invoice.   Our billing staff will not be able to assist you with questions regarding bills from these companies.  You will be contacted with the lab results as soon as they are available. The fastest way to get your results is to activate your My Chart account. Instructions are located on the last page of this paperwork. If you have not heard from [place] regarding the results in 2 weeks, please contact this office.    1 year card given with history of high blood pressure.  Keep routine follow-up with your primary care provider.  Thank you for coming in today and stay safe.

## 2019-06-08 NOTE — Progress Notes (Signed)
Subjective:  Patient ID: Antonio Washington, male    DOB: 05-02-1950  Age: 69 y.o. MRN: [id number]  CC:  Chief Complaint  Patient presents with  . DOT    Pt state he feels good with no complaints.     HPI Antonio Washington presents for   DOT physical.  History of hypertension, hyperlipidemia on meds.  Annual physical exam with his primary care provider in December, A1c at prediabetes level, not diabetes.  Denies hx of heart disease, no dyspnea or chest pain with exertion.  No hx of OSA. No snoring known, no daytime somnolence. No msk weakness/deficits.  No eye issues, no difficulty with glare, no hx of glaucoma/aphakia. No corrective lenses.     History Patient Active Problem List   Diagnosis Date Noted  . Cervical radiculopathy 12/22/2017  . Chest pain at rest 12/24/2012  . Acute renal insufficiency 12/24/2012  . FATIGUE, ACUTE 07/24/2009   Past Medical History:  Diagnosis Date  . Asthma    childhood  . Hyperlipidemia    No past surgical history on file. No Known Allergies Prior to Admission medications   Medication Sig Start Date End Date Taking? Authorizing Provider  amLODipine-benazepril (LOTREL) 10-20 MG capsule Take 1 capsule by mouth daily. 01/30/19  Yes Stallings, Zoe A, MD  atorvastatin (LIPITOR) 20 MG tablet Take 1 tablet (20 mg total) by mouth daily. 01/30/19  Yes Doristine Bosworth, MD  Cholecalciferol (VITAMIN D) 50 MCG (2000 UT) tablet Take 1 tablet (2,000 Units total) by mouth daily. 05/11/19  Yes Doristine Bosworth, MD  NON FORMULARY Vitamin D3 50 mcg (2,000 unit) tablet   Yes [provider]  mupirocin ointment (BACTROBAN) 2 % Apply 1 application topically 3 (three) times daily. Patient not taking: Reported on 05/30/2018 11/03/17   Wallis Bamberg, PA-C   Social History   Socioeconomic History  . Marital status: Single    Spouse name: Not on file  . Number of children: 2  . Years of education: Not on file  . Highest education level: Not on file    Occupational History  . Not on file  Tobacco Use  . Smoking status: Never Smoker  . Smokeless tobacco: Never Used  Substance and Sexual Activity  . Alcohol use: No    Alcohol/week: 0.0 standard drinks  . Drug use: No  . Sexual activity: Yes  Other Topics Concern  . Not on file  Social History Narrative   Diet:      Do you drink/ eat things with caffeine? No       Marital status:    No                           What year were you married ?      Do you live in a house, apartment,assistred living, condo, trailer, etc.)? House      Is it one or more stories? 2      How many persons live in your home ? 1      Do you have any pets in your home ?(please list) No      Current or past profession: Midwife       Do you exercise?  At Times                            Type & how often: Run-Run  Do you have a living will? No      Do you have a DNR form? No                      If not, do you want to discuss one? Yes      Do you have signed POA?HPOA forms?  No               If so, please bring to your        appointment      Social Determinants of Health   Financial Resource Strain:   . Difficulty of Paying Living Expenses:   Food Insecurity:   . Worried About Programme researcher, broadcasting/film/video in the Last Year:   . Barista in the Last Year:   Transportation Needs:   . Freight forwarder (Medical):   Marland Kitchen Lack of Transportation (Non-Medical):   Physical Activity:   . Days of Exercise per Week:   . Minutes of Exercise per Session:   Stress:   . Feeling of Stress :   Social Connections:   . Frequency of Communication with Friends and Family:   . Frequency of Social Gatherings with Friends and Family:   . Attends Religious Services:   . Active Member of Clubs or Organizations:   . Attends Banker Meetings:   Marland Kitchen Marital Status:   Intimate Partner Violence:   . Fear of Current or Ex-Partner:   . Emotionally Abused:   Marland Kitchen Physically Abused:   . Sexually  Abused:     Review of Systems Negative per DOT physical.   Objective:   Vitals:   06/08/19 1519  BP: 125/82  Pulse: 81  Temp: 98.5 F (36.9 C)  TempSrc: Temporal  SpO2: 98%  Weight: 207 lb (93.9 kg)  Height: 6' (1.829 m)     Physical Exam Vitals reviewed.  Constitutional:      Appearance: He is well-developed.  HENT:     Head: Normocephalic and atraumatic.     Right Ear: External ear normal.     Left Ear: External ear normal.  Eyes:     Conjunctiva/sclera: Conjunctivae normal.     Pupils: Pupils are equal, round, and reactive to light.  Neck:     Thyroid: No thyromegaly.     Vascular: No carotid bruit or JVD.  Cardiovascular:     Rate and Rhythm: Normal rate and regular rhythm.     Heart sounds: Normal heart sounds. No murmur.  Pulmonary:     Effort: Pulmonary effort is normal. No respiratory distress.     Breath sounds: Normal breath sounds. No wheezing or rales.  Abdominal:     General: There is no distension.     Palpations: Abdomen is soft.     Tenderness: There is no abdominal tenderness.     Hernia: There is no hernia in the left inguinal area or right inguinal area.  Musculoskeletal:        General: No tenderness. Normal range of motion.     Cervical back: Normal range of motion and neck supple.  Lymphadenopathy:     Cervical: No cervical adenopathy.  Skin:    General: Skin is warm and dry.  Neurological:     Mental Status: He is alert and oriented to person, place, and time.     Deep Tendon Reflexes: Reflexes are normal and symmetric.  Psychiatric:        Behavior: Behavior normal.  Assessment & Plan:  Antonio Washington is a 69 y.o. male . Encounter for commercial driver medical examination (CDME)  -History of hypertension, hyperlipidemia on medication.  No concerns on exam, history, see DOT paperwork.  1 year card.  No orders of the defined types were placed in this encounter.  Patient Instructions       If you have lab work  done today you will be contacted with your lab results within the next 2 weeks.  If you have not heard from [place] then please contact us. The fastest way to get your results is to register for My Chart.   IF you received an x-ray today, you will receive an invoice from [facility]. Please contact [facility] at [phone] with questions or concerns regarding your invoice.   IF you received labwork today, you will receive an invoice from [place]. Please contact [facility] at [phone] with questions or concerns regarding your invoice.   Our billing staff will not be able to assist you with questions regarding bills from these companies.  You will be contacted with the lab results as soon as they are available. The fastest way to get your results is to activate your My Chart account. Instructions are located on the last page of this paperwork. If you have not heard from [place] regarding the results in 2 weeks, please contact this office.    1 year card given with history of high blood pressure.  Keep routine follow-up with your primary care provider.  Thank you for coming in today and stay safe.     Signed, Merri Ray, MD [facility]

## 2019-07-31 DIAGNOSIS — N401 Enlarged prostate with lower urinary tract symptoms: Secondary | ICD-10-CM | POA: Diagnosis not present

## 2019-07-31 DIAGNOSIS — R3912 Poor urinary stream: Secondary | ICD-10-CM | POA: Diagnosis not present

## 2019-07-31 DIAGNOSIS — R350 Frequency of micturition: Secondary | ICD-10-CM | POA: Diagnosis not present

## 2020-02-19 IMAGING — DX DG CERVICAL SPINE COMPLETE 4+V
6 series · 6 of 6 positions shown · non-contrast
Comparison: 12/24/2012

CLINICAL DATA: Right arm tingling

EXAM:
CERVICAL SPINE - COMPLETE 4+ VIEW

[c-spine lat]
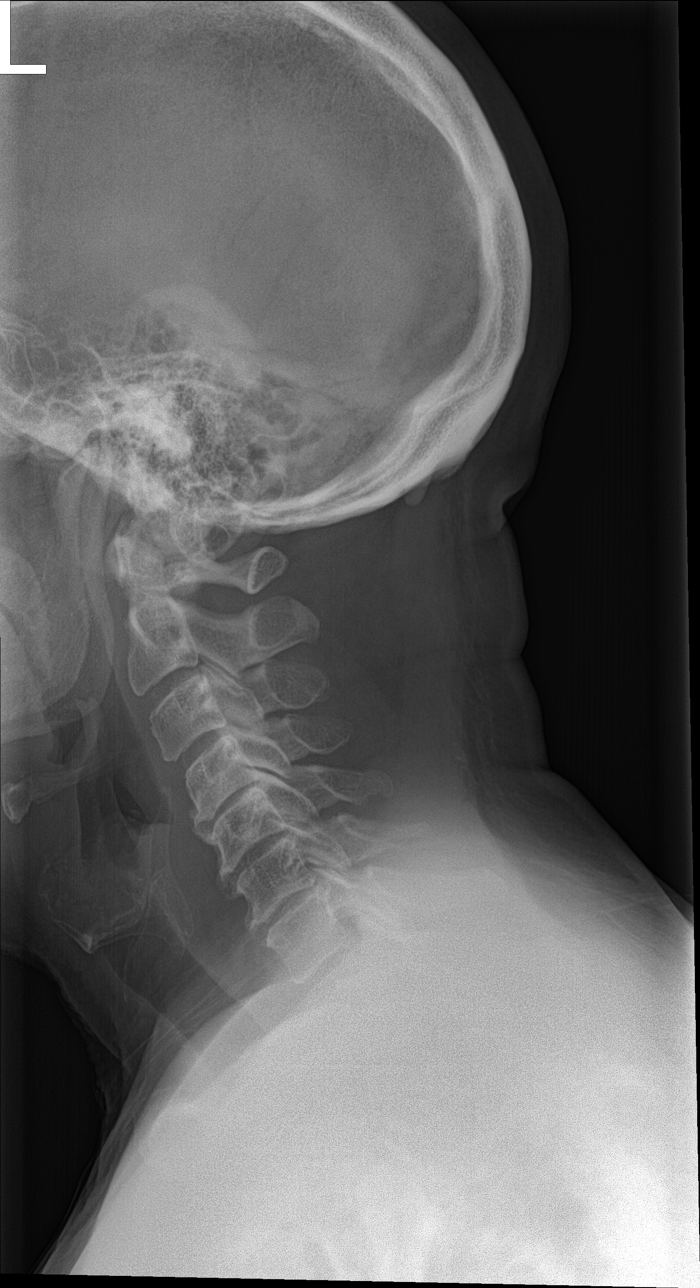

[c-spine obl (1 of 2)]
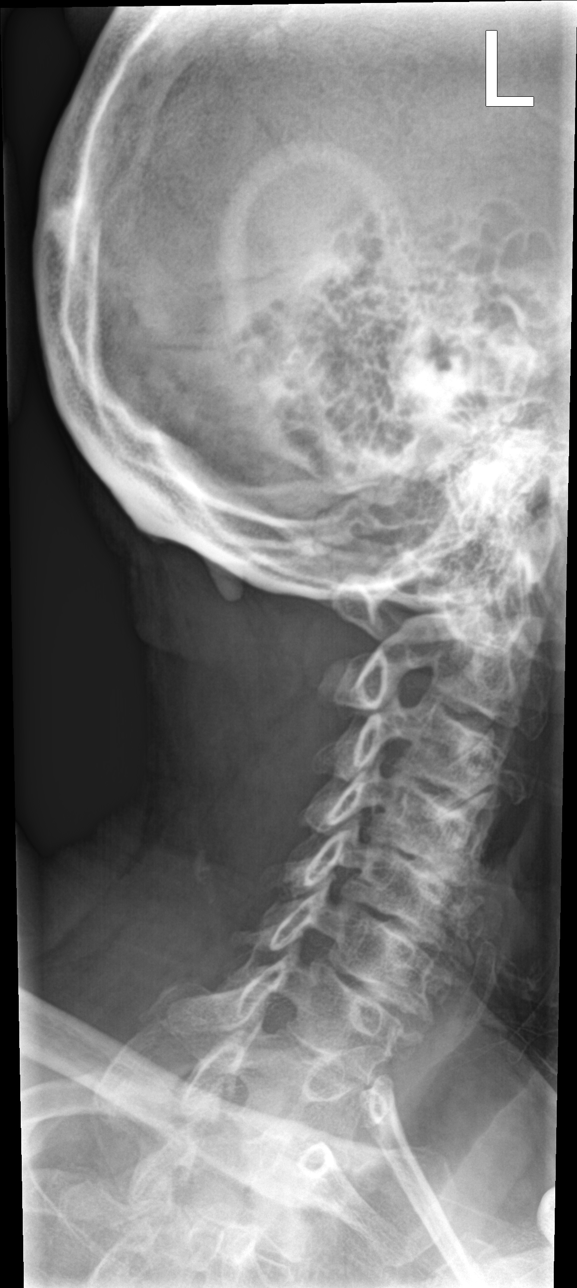

[c-spine obl (2 of 2)]
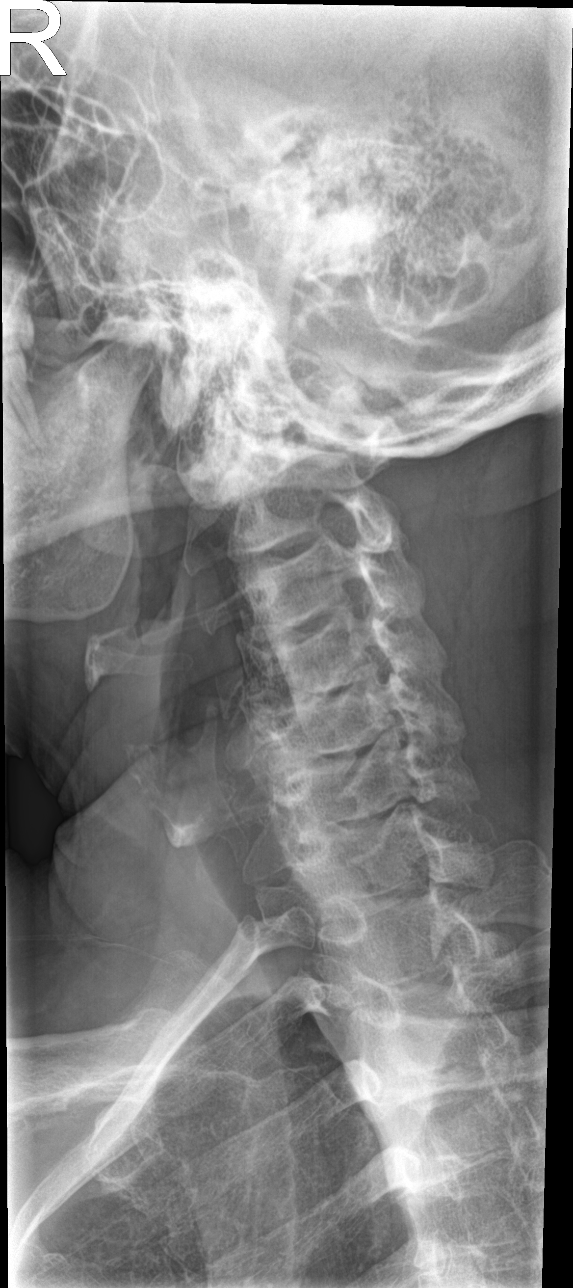

[c-spine ap]
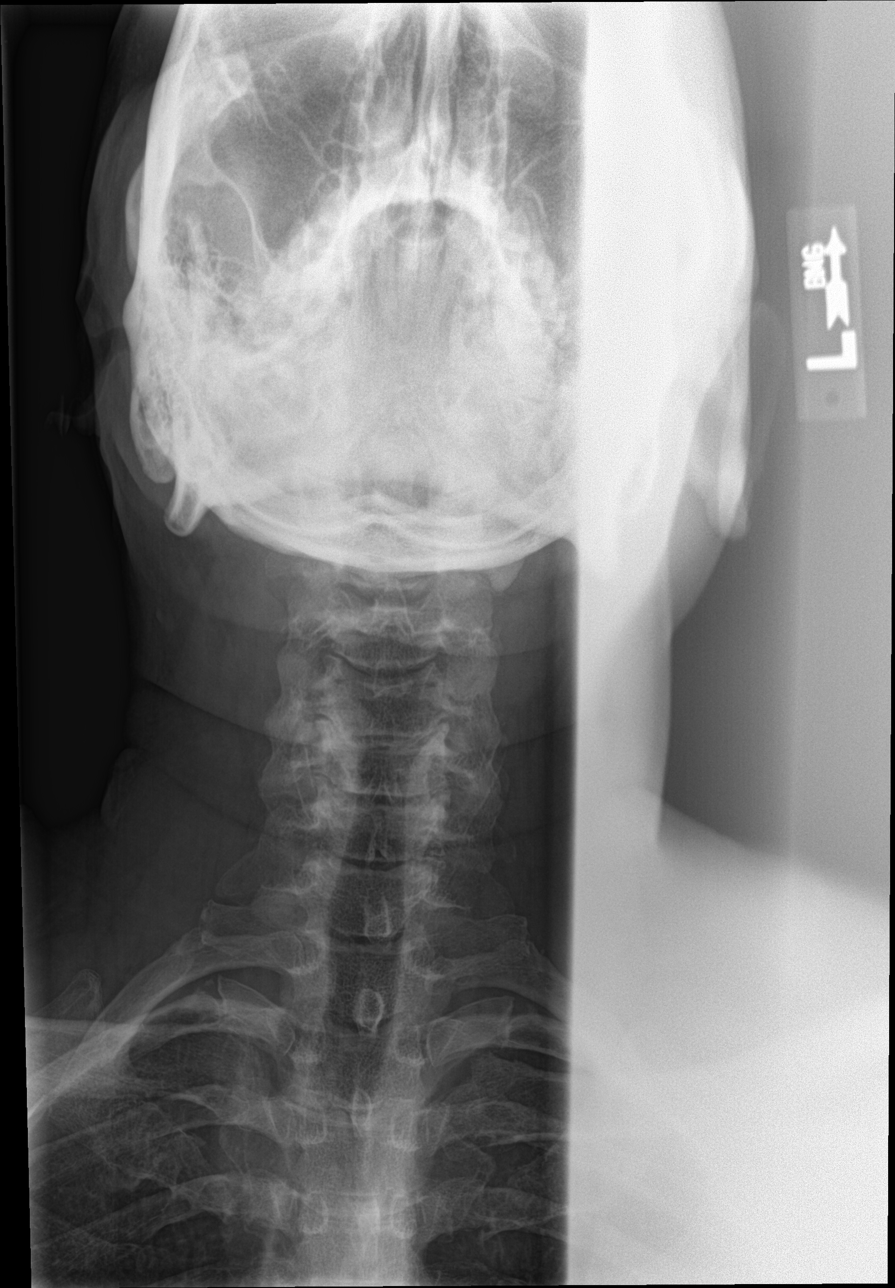

[c-spine open mouth]
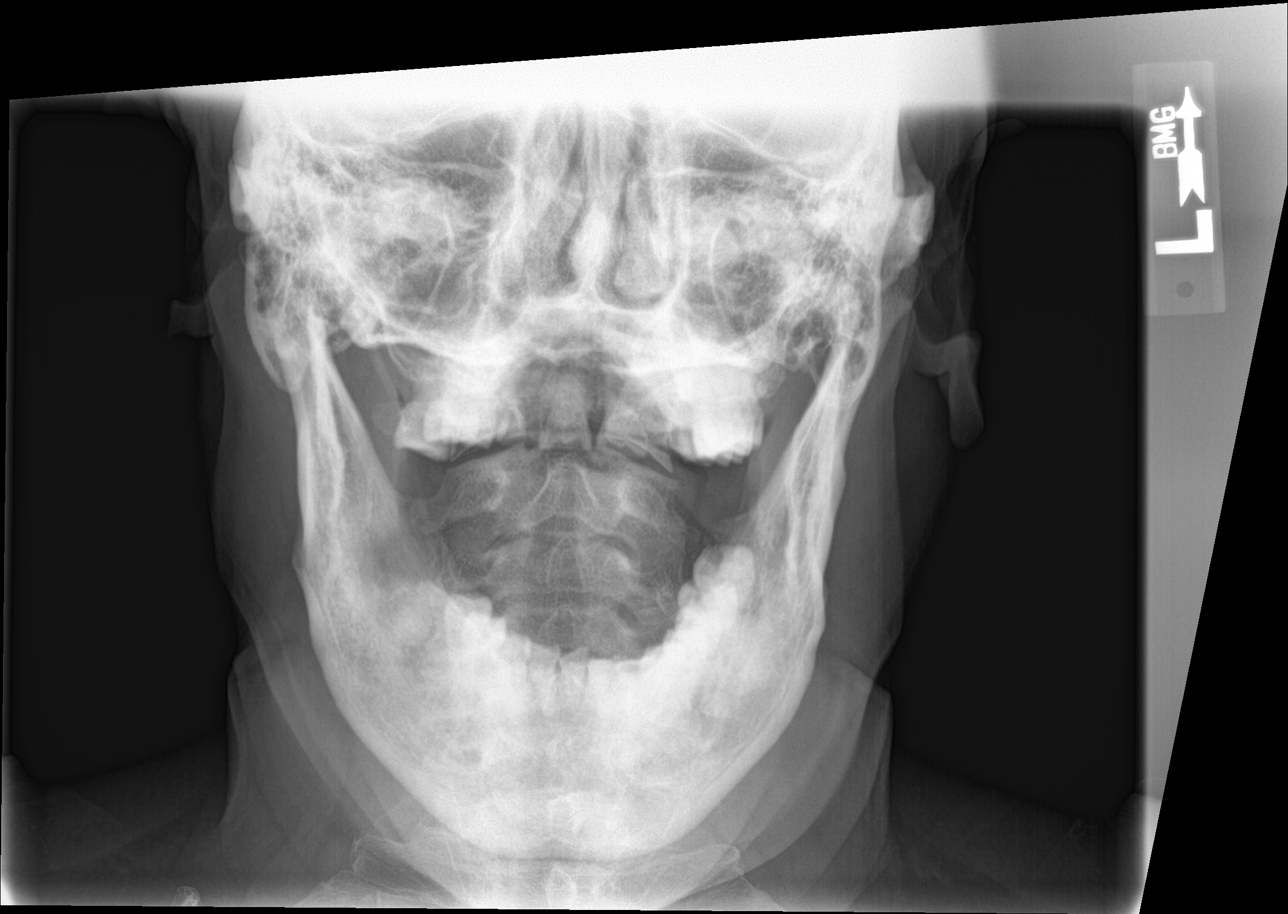

[c-spine swimmers]
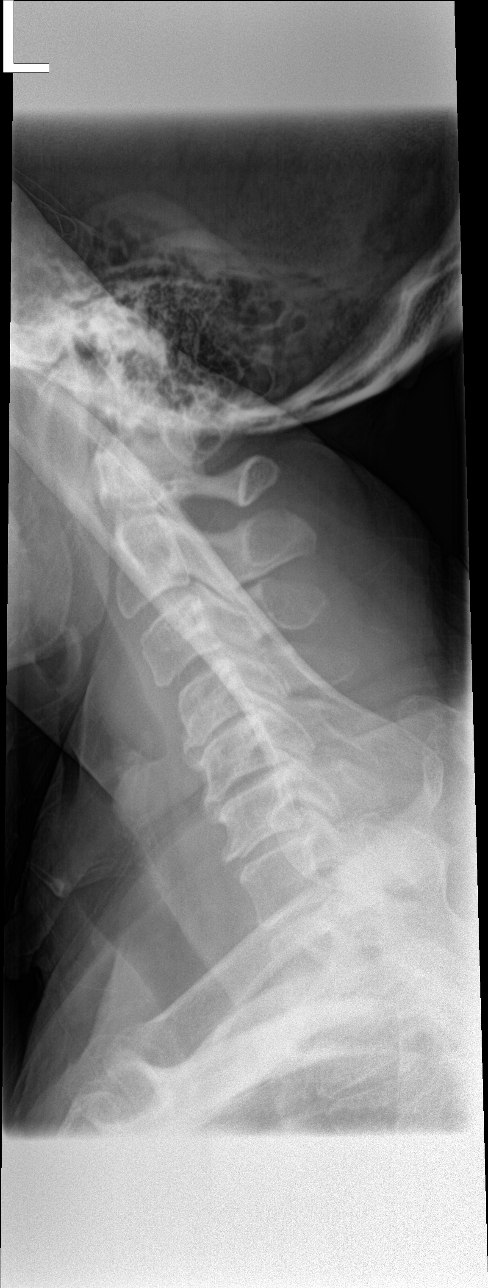

[6 of 6 positions shown; findings below may reference images not displayed]

FINDINGS: Degenerative disc disease with disc space narrowing and spurring
from C4-5 thru C6-7. Uncovertebral spurring causes bilateral
multilevel neural foraminal narrowing, on the right at C3-4 through
C5-6 and diffusely on the left. Degenerative facet disease
bilaterally. No fracture or malalignment. Prevertebral soft tissues
are normal.
IMPRESSION: Degenerative disc and facet disease. Bilateral neural foraminal
narrowing as above. No real change since prior study. No acute bony
abnormality.

## 2020-06-30 ENCOUNTER — Telehealth: Payer: Self-pay | Admitting: Family Medicine

## 2020-06-30 DIAGNOSIS — Z1211 Encounter for screening for malignant neoplasm of colon: Secondary | ICD-10-CM

## 2020-06-30 NOTE — Telephone Encounter (Signed)
Pt called in stating for an order to be placed for a cologaurd testing kit, please advise pt can be reached at the home #. He states that Dr. Carlota Raspberry is his pcp

## 2020-07-01 NOTE — Telephone Encounter (Signed)
Ok - cologuard ordered 

## 2020-07-01 NOTE — Telephone Encounter (Signed)
Cologuard ordered but please verify that he has no family history of colon cancer, or personal history of polyps or bleeding as that would not be appropriate test.  If those responses are positive, then will need to be referred for colonoscopy.

## 2020-07-01 NOTE — Telephone Encounter (Signed)
Called pt and he reports no known family history of colon cancer and no personal history bleeding or polyps

## 2021-06-27 ENCOUNTER — Encounter (HOSPITAL_COMMUNITY): Payer: Self-pay | Admitting: *Deleted

## 2021-06-27 ENCOUNTER — Emergency Department (HOSPITAL_COMMUNITY): Payer: Medicare HMO

## 2021-06-27 ENCOUNTER — Other Ambulatory Visit: Payer: Self-pay

## 2021-06-27 ENCOUNTER — Emergency Department (HOSPITAL_COMMUNITY)
Admission: EM | Admit: 2021-06-27 | Discharge: 2021-06-27 | Disposition: A | Discharge status: Home / Self Care | Payer: Medicare HMO | Attending: Emergency Medicine | Admitting: Emergency Medicine

## 2021-06-27 DIAGNOSIS — Z79899 Other long term (current) drug therapy: Secondary | ICD-10-CM | POA: Insufficient documentation

## 2021-06-27 DIAGNOSIS — R109 Unspecified abdominal pain: Secondary | ICD-10-CM | POA: Insufficient documentation

## 2021-06-27 DIAGNOSIS — I10 Essential (primary) hypertension: Secondary | ICD-10-CM | POA: Insufficient documentation

## 2021-06-27 HISTORY — DX: Essential (primary) hypertension: I10

## 2021-06-27 LAB — CBC
HCT: 45 % (ref 39.0–52.0)
Hemoglobin: 15.8 g/dL (ref 13.0–17.0)
MCH: 31 pg (ref 26.0–34.0)
MCHC: 35.1 g/dL (ref 30.0–36.0)
MCV: 88.2 fL (ref 80.0–100.0)
Platelets: 204 10*3/uL (ref 150–400)
RBC: 5.1 MIL/uL (ref 4.22–5.81)
RDW: 14.4 % (ref 11.5–15.5)
WBC: 4.8 10*3/uL (ref 4.0–10.5)
nRBC: 0 % (ref 0.0–0.2)

## 2021-06-27 LAB — COMPREHENSIVE METABOLIC PANEL
ALT: 25 U/L (ref 0–44)
AST: 22 U/L (ref 15–41)
Albumin: 3.7 g/dL (ref 3.5–5.0)
Alkaline Phosphatase: 38 U/L (ref 38–126)
Anion gap: 4 — ABNORMAL LOW (ref 5–15)
BUN: 16 mg/dL (ref 8–23)
CO2: 26 mmol/L (ref 22–32)
Calcium: 8.9 mg/dL (ref 8.9–10.3)
Chloride: 108 mmol/L (ref 98–111)
Creatinine, Ser: 1.22 mg/dL (ref 0.61–1.24)
GFR, Estimated: >60 mL/min (ref >60)
Glucose, Bld: 124 mg/dL — ABNORMAL HIGH (ref 70–99)
Potassium: 4.2 mmol/L (ref 3.5–5.1)
Sodium: 138 mmol/L (ref 135–145)
Total Bilirubin: 1 mg/dL (ref 0.3–1.2)
Total Protein: 6.9 g/dL (ref 6.5–8.1)

## 2021-06-27 LAB — URINALYSIS, ROUTINE W REFLEX MICROSCOPIC
Bacteria, UA: NONE SEEN
Bilirubin Urine: NEGATIVE
Glucose, UA: NEGATIVE mg/dL
Hgb urine dipstick: NEGATIVE
Ketones, ur: NEGATIVE mg/dL
Nitrite: NEGATIVE
Protein, ur: NEGATIVE mg/dL
Specific Gravity, Urine: 1.021 (ref 1.005–1.030)
pH: 5 (ref 5.0–8.0)

## 2021-06-27 LAB — LIPASE, BLOOD: Lipase: 40 U/L (ref 11–51)

## 2021-06-27 MED ORDER — AMLODIPINE BESYLATE 5 MG PO TABS
10.0000 mg | ORAL_TABLET | Freq: Once | ORAL | Status: AC
  Administered 2021-06-27: 10 mg via ORAL
  Filled 2021-06-27: qty 2

## 2021-06-27 MED ORDER — AMLODIPINE BESY-BENAZEPRIL HCL 10-20 MG PO CAPS: 1.0000 | ORAL_CAPSULE | Freq: Every day | ORAL | 1 refills | Status: AC

## 2021-06-27 MED ORDER — IBUPROFEN 600 MG PO TABS: 600.0000 mg | ORAL_TABLET | Freq: Four times a day (QID) | ORAL | 0 refills | Status: AC | PRN

## 2021-06-27 NOTE — Discharge Instructions (Addendum)
I have sent ibuprofen to your pharmacy as well.  You may take Tylenol with this however do not take naproxen or aspirin with this.  Try and take the ibuprofen only as needed and with food.  You should not exceed 2400 mg a day. ? ?Follow-up with your primary care provider as needed and return for any worsening symptoms.  I have also refilled your blood pressure medications which should be at your pharmacy. ?

## 2021-06-27 NOTE — ED Triage Notes (Signed)
Left posterior flank pain started Thursday night and had become worse. ?

## 2021-06-27 NOTE — ED Provider Notes (Signed)
[facility]-EMERGENCY DEPT Provider Note   CSN: [id number] Arrival date & time: 06/27/21  4098     History  Chief Complaint  Patient presents with   Flank Pain    Antonio Washington. is a 71 y.o. male with a past medical history of hypertension and hyperlipidemia presenting today with left flank pain.  Reports that this began on Thursday afternoon.  At that time it was mild however over the past 2 days it has become more intense.  Says that it comes and goes and when it comes it comes on very sharp.  No associated nausea or vomiting.  Still having normal bowel movements.  No history of kidney stone.  Denies dysuria or hematuria.  Says that the pain is worse when he is ambulating however better when he takes ibuprofen.  Says it is possible that he pulled a muscle during increasing sexual activity over the past few days however the pain began 5 to 6 hours after sexual activity on Thursday. Reports 2 sexual partners, no concerns for any STDs.  No testicular pain, penile pain or penile discharge.   Flank Pain Pertinent negatives include no abdominal pain.      Home Medications Prior to Admission medications   Medication Sig Start Date End Date Taking? Authorizing Provider  amLODipine-benazepril (LOTREL) 10-20 MG capsule Take 1 capsule by mouth daily. 01/30/19   Doristine Bosworth, MD  atorvastatin (LIPITOR) 20 MG tablet Take 1 tablet (20 mg total) by mouth daily. 01/30/19   Doristine Bosworth, MD  Cholecalciferol (VITAMIN D) 50 MCG (2000 UT) tablet Take 1 tablet (2,000 Units total) by mouth daily. 05/11/19   Doristine Bosworth, MD  mupirocin ointment (BACTROBAN) 2 % Apply 1 application topically 3 (three) times daily. Patient not taking: Reported on 05/30/2018 11/03/17   Wallis Bamberg, PA-C  NON FORMULARY Vitamin D3 50 mcg (2,000 unit) tablet    [provider]      Allergies    Patient has no known allergies.    Review of Systems   Review of Systems  Constitutional:   Negative for chills and fever.  Gastrointestinal:  Negative for abdominal pain, constipation, diarrhea and vomiting.  Genitourinary:  Positive for flank pain. Negative for decreased urine volume, dysuria, hematuria, penile discharge, penile pain, penile swelling, scrotal swelling and urgency.   Physical Exam Updated Vital Signs BP (!) 171/90 (BP Location: Left Arm)   Pulse 79   Temp 98 F (36.7 C) (Oral)   Resp 17   Ht 5\' 11"  (1.803 m)   Wt 92.5 kg   SpO2 97%   BMI 28.45 kg/m  Physical Exam Vitals and nursing note reviewed.  Constitutional:      General: He is not in acute distress.    Appearance: Normal appearance. He is not ill-appearing.  HENT:     Head: Normocephalic and atraumatic.  Eyes:     General: No scleral icterus.    Conjunctiva/sclera: Conjunctivae normal.  Pulmonary:     Effort: Pulmonary effort is normal. No respiratory distress.  Abdominal:     General: Abdomen is flat.     Palpations: Abdomen is soft.     Tenderness: There is no abdominal tenderness. There is no right CVA tenderness or left CVA tenderness.  Skin:    General: Skin is warm and dry.     Findings: No rash.  Neurological:     Mental Status: He is alert.  Psychiatric:  Mood and Affect: Mood normal.    ED Results / Procedures / Treatments   Labs (all labs ordered are listed, but only abnormal results are displayed) Labs Reviewed  URINALYSIS, ROUTINE W REFLEX MICROSCOPIC - Abnormal; Notable for the following components:      Result Value   Leukocytes,Ua SMALL (*)    All other components within normal limits  COMPREHENSIVE METABOLIC PANEL - Abnormal; Notable for the following components:   Glucose, Bld 124 (*)    Anion gap 4 (*)    All other components within normal limits  CBC  LIPASE, BLOOD    EKG None  Radiology CT RENAL STONE STUDY  Result Date: 06/27/2021 CLINICAL DATA:  Nephrolithiasis.  Left flank pain EXAM: CT ABDOMEN AND PELVIS WITHOUT CONTRAST TECHNIQUE:  Multidetector CT imaging of the abdomen and pelvis was performed following the standard protocol without IV contrast. RADIATION DOSE REDUCTION: This exam was performed according to the departmental dose-optimization program which includes automated exposure control, adjustment of the mA and/or kV according to patient size and/or use of iterative reconstruction technique. COMPARISON:  None. FINDINGS: Lower chest: No acute abnormality. Hepatobiliary: Mild hepatic steatosis. No focal liver abnormality. Gallbladder is normal. No bile duct dilatation. Pancreas: Unremarkable. No pancreatic ductal dilatation or surrounding inflammatory changes. Spleen: Normal in size without focal abnormality. Adrenals/Urinary Tract: Left adrenal nodule measures 1.7 cm and -9.29 Hounsfield units. This is compatible with a benign adenoma. No follow-up recommended. Normal right adrenal gland. No mass, hydronephrosis or nephrolithiasis identified bilaterally. No hydroureter or ureteral lithiasis. Bladder appears decompressed. Stomach/Bowel: Stomach is within normal limits. Appendix appears normal. No evidence of bowel wall thickening, distention, or inflammatory changes. Colonic diverticulosis without signs of acute diverticulitis. Vascular/Lymphatic: Aortic atherosclerosis. No enlarged abdominal or pelvic lymph nodes. Reproductive: Prostate gland enlargement. Other: Small fat containing inguinal hernias identified. No free fluid or fluid collections. No pneumoperitoneum. Musculoskeletal: Degenerative disc disease identified at L5-S1. IMPRESSION: 1. No acute findings within the abdomen or pelvis. 2. No nephrolithiasis or hydronephrosis. 3. Hepatic steatosis. 4. Colonic diverticulosis without signs of acute diverticulitis. 5. Aortic Atherosclerosis (ICD10-I70.0). Electronically Signed   By: Signa Kell M.D.   On: 06/27/2021 11:03    Procedures Procedures   Medications Ordered in ED Medications  amLODipine (NORVASC) tablet 10 mg (has  no administration in time range)    ED Course/ Medical Decision Making/ A&P                           Medical Decision Making Amount and/or Complexity of Data Reviewed Labs: ordered. Radiology: ordered.  Risk Prescription drug management.   This patient presents to the ED for concern of flank pain.  Differential includes but is not limited to nephrolithiasis, pyelonephritis, pancreatitis, ACS, AAA, renal mass, UTI, testicular torsion.    This is not an exhaustive differential.    Past Medical History / Co-morbidities / Social History: Hypertension   Additional history: Additional history obtained from chart review.  Patient only has a history of hypertension and hyperlipidemia and follows closely with primary care   Physical Exam: Physical exam performed. The pertinent findings include: Nontender abdomen and negative CVA tenderness  Lab Tests: I ordered, and personally interpreted labs.  The pertinent results include: No pertinent findings   Imaging Studies: I ordered imaging studies including CT renal. I independently visualized and interpreted imaging which showed no stones. I agree with the radiologist interpretation.     Medications: Patient denied the need for  medications for his pain at this time.  Says he is without pain.  Requesting blood pressure medication that he has not taken in 2 days.  Amlodipine ordered.   Disposition: After consideration of the diagnostic results and the patients response to treatment, I feel that patient is stable for discharge.  Unknown etiology of his flank pain.  Possibly a passed kidney stone, possibly a muscle strain due to increased physical activity.  Vital signs are stable and lab work within normal limits.  At this time patient will be discharged with PCP follow-up.  He is agreeable to this plan.  He will continue to take ibuprofen for any discomfort.  We also discussed heat pads, stretches and rest.   I discussed this case with my  attending physician Dr. Karene Fry who cosigned this note including patient's presenting symptoms, physical exam, and planned diagnostics and interventions. Attending physician stated agreement with plan or made changes to plan which were implemented.     Final Clinical Impression(s) / ED Diagnoses Final diagnoses:  Flank pain    Rx / DC Orders ED Discharge Orders          Ordered    amLODipine-benazepril (LOTREL) 10-20 MG capsule  Daily        06/27/21 1146    ibuprofen (ADVIL) 600 MG tablet  Every 6 hours PRN        06/27/21 1157           Results and diagnoses were explained to the patient. Return precautions discussed in full. Patient had no additional questions and expressed complete understanding.   This chart was dictated using voice recognition software.  Despite best efforts to proofread,  errors can occur which can change the documentation meaning.    Saddie Benders, PA-C 06/27/21 1201    Ernie Avena, MD 06/27/21 1207

## 2022-01-10 LAB — COLOGUARD: COLOGUARD: NEGATIVE

## 2022-01-10 LAB — EXTERNAL GENERIC LAB PROCEDURE: COLOGUARD: NEGATIVE

## 2022-12-27 ENCOUNTER — Ambulatory Visit: Payer: Medicare HMO | Admitting: Podiatry

## 2022-12-27 ENCOUNTER — Encounter: Payer: Self-pay | Admitting: Podiatry

## 2022-12-27 DIAGNOSIS — L853 Xerosis cutis: Secondary | ICD-10-CM

## 2022-12-27 DIAGNOSIS — Z79899 Other long term (current) drug therapy: Secondary | ICD-10-CM | POA: Diagnosis not present

## 2022-12-27 DIAGNOSIS — M79675 Pain in left toe(s): Secondary | ICD-10-CM | POA: Diagnosis not present

## 2022-12-27 DIAGNOSIS — M79674 Pain in right toe(s): Secondary | ICD-10-CM

## 2022-12-27 DIAGNOSIS — B351 Tinea unguium: Secondary | ICD-10-CM

## 2022-12-27 MED ORDER — AMMONIUM LACTATE 12 % EX CREA: 1.0000 | TOPICAL_CREAM | CUTANEOUS | 0 refills | Status: AC | PRN

## 2022-12-27 NOTE — Patient Instructions (Signed)
Terbinafine Tablets What is this medication? TERBINAFINE (TER bin a feen) treats fungal infections of the nails. It belongs to a group of medications called antifungals. It will not treat infections caused by bacteria or viruses. This medicine may be used for other purposes; ask your health care provider or pharmacist if you have questions. COMMON BRAND NAME(S): Lamisil, Terbinex What should I tell my care team before I take this medication? They need to know if you have any of these conditions: Liver disease An unusual or allergic reaction to terbinafine, other medications, foods, dyes, or preservatives Pregnant or trying to get pregnant Breast-feeding How should I use this medication? Take this medication by mouth with water. Take it as directed on the prescription label at the same time every day. You can take it with or without food. If it upsets your stomach, take it with food. Keep taking it unless your care team tells you to stop. A special MedGuide will be given to you by the pharmacist with each prescription and refill. Be sure to read this information carefully each time. Talk to your care team regarding the use of this medication in children. Special care may be needed. Overdosage: If you think you have taken too much of this medicine contact a poison control center or emergency room at once. NOTE: This medicine is only for you. Do not share this medicine with others. What if I miss a dose? If you miss a dose, take it as soon as you can unless it is more than 4 hours late. If it is more than 4 hours late, skip the missed dose. Take the next dose at the normal time. What may interact with this medication? Do not take this medication with any of the following: Pimozide Thioridazine This medication may also interact with the following: Beta blockers Caffeine Certain medications for mental health conditions Cimetidine Cyclosporine Medications for fungal infections like fluconazole  and ketoconazole Medications for irregular heartbeat like amiodarone, flecainide and propafenone Rifampin Warfarin This list may not describe all possible interactions. Give your health care provider a list of all the medicines, herbs, non-prescription drugs, or dietary supplements you use. Also tell them if you smoke, drink alcohol, or use illegal drugs. Some items may interact with your medicine. What should I watch for while using this medication? Visit your care team for regular checks on your progress. You may need blood work while you are taking this medication. It may be some time before you see the benefit from this medication. This medication may cause serious skin reactions. They can happen weeks to months after starting the medication. Contact your care team right away if you notice fevers or flu-like symptoms with a rash. The rash may be red or purple and then turn into blisters or peeling of the skin. Or, you might notice a red rash with swelling of the face, lips or lymph nodes in your neck or under your arms. This medication can make you more sensitive to the sun. Keep out of the sun, If you cannot avoid being in the sun, wear protective clothing and sunscreen. Do not use sun lamps or tanning beds/booths. What side effects may I notice from receiving this medication? Side effects that you should report to your care team as soon as possible: Allergic reactions--skin rash, itching, hives, swelling of the face, lips, tongue, or throat Change in sense of smell Change in taste Infection--fever, chills, cough, or sore throat Liver injury--right upper belly pain, loss of appetite, nausea,   light-colored stool, dark yellow or brown urine, yellowing skin or eyes, unusual weakness or fatigue Low red blood cell level--unusual weakness or fatigue, dizziness, headache, trouble breathing Lupus-like syndrome--joint pain, swelling, or stiffness, butterfly-shaped rash on the face, rashes that get worse  in the sun, fever, unusual weakness or fatigue Rash, fever, and swollen lymph nodes Redness, blistering, peeling, or loosening of the skin, including inside the mouth Unusual bruising or bleeding Worsening mood, feelings of depression Side effects that usually do not require medical attention (report to your care team if they continue or are bothersome): Diarrhea Gas Headache Nausea Stomach pain Upset stomach This list may not describe all possible side effects. Call your doctor for medical advice about side effects. You may report side effects to FDA at 1-800-FDA-1088. Where should I keep my medication? Keep out of the reach of children and pets. Store between 20 and 25 degrees C (68 and 77 degrees F). Protect from light. Get rid of any unused medication after the expiration date. To get rid of medications that are no longer needed or have expired: Take the medication to a medication take-back program. Check with your pharmacy or law enforcement to find a location. If you cannot return the medication, check the label or package insert to see if the medication should be thrown out in the garbage or flushed down the toilet. If you are not sure, ask your care team. If it is safe to put it in the trash, take the medication out of the container. Mix the medication with cat litter, dirt, coffee grounds, or other unwanted substance. Seal the mixture in a bag or container. Put it in the trash. NOTE: This sheet is a summary. It may not cover all possible information. If you have questions about this medicine, talk to your doctor, pharmacist, or health care provider.  2024 Elsevier/Gold Standard (2020-10-01 00:00:00)  

## 2023-01-02 NOTE — Progress Notes (Signed)
  Subjective:  Patient ID: Antonio Washington., male    DOB: 1950-10-07,  MRN: [id number]  Chief Complaint  Patient presents with   Nail Problem    PATIENT STATES HE BELIEVES HE HAS A INFECTION IN HIS LF HALLUX , PATIENT STATES IT IS NOT PAINFUL BUT WANTS TO MAKE SURE IT IS NOT INFECTION IN LEFT HALLUXS    Discussed the use of AI scribe software for clinical note transcription with the patient, who gave verbal consent to proceed.  History of Present Illness          72 year old male presents with a longstanding issue of a thick, brittle, discolored left big toenail, suspected to be a fungal infection. They deny any associated pain and have not previously sought treatment. They also express concern about the rough texture of their feet, comparing it to cardboard.    Objective:    Physical Exam         General: AAO x3, NAD  Dermatological: Nails are hypertrophic, dystrophic with yellow, brown discoloration.  There is tenderness nails 1-5 bilaterally.  No edema, erythema or signs of infection.  Dry skin is present plantarly without any open lesions noted.  Vascular: Dorsalis Pedis artery and Posterior Tibial artery pedal pulses are palpable bilateral with immedate capillary fill time. There is no pain with calf compression, swelling, warmth, erythema.   Neruologic: Grossly intact via light touch bilateral.  Musculoskeletal: No other areas of discomfort.  No images are attached to the encounter.    Results          Assessment:   Onychosis, dry skin   Plan:  Patient was evaluated and treated and all questions answered.  Assessment and Plan           -Treatment options discussed including all alternatives, risks, and complications -Etiology of symptoms were discussed -Nails debrided 10 without complications or bleeding. -Discussed the risks/benefits of oral (Lamisil) vs topical treatment vs alternative treatments (laser, nail removal). -Check liver function before  starting Lamisil and at 6 weeks into treatment. -If liver function is normal, start Lamisil 1 pill daily for 90 days. -Daily foot inspection -Follow-up in 3 months or sooner if any problems arise. In the meantime, encouraged to call the office with any questions, concerns, change in symptoms.   Ovid Curd, DPM   Return in about 3 months (around 03/29/2023).

## 2023-05-07 ENCOUNTER — Emergency Department (HOSPITAL_BASED_OUTPATIENT_CLINIC_OR_DEPARTMENT_OTHER)
Admission: EM | Admit: 2023-05-07 | Discharge: 2023-05-07 | Disposition: A | Discharge status: Home / Self Care | Attending: Emergency Medicine | Admitting: Emergency Medicine

## 2023-05-07 ENCOUNTER — Other Ambulatory Visit: Payer: Self-pay

## 2023-05-07 ENCOUNTER — Emergency Department (HOSPITAL_BASED_OUTPATIENT_CLINIC_OR_DEPARTMENT_OTHER): Admitting: Radiology

## 2023-05-07 ENCOUNTER — Encounter (HOSPITAL_BASED_OUTPATIENT_CLINIC_OR_DEPARTMENT_OTHER): Payer: Self-pay

## 2023-05-07 DIAGNOSIS — S161XXA Strain of muscle, fascia and tendon at neck level, initial encounter: Secondary | ICD-10-CM | POA: Insufficient documentation

## 2023-05-07 DIAGNOSIS — X58XXXA Exposure to other specified factors, initial encounter: Secondary | ICD-10-CM | POA: Diagnosis not present

## 2023-05-07 DIAGNOSIS — H6122 Impacted cerumen, left ear: Secondary | ICD-10-CM | POA: Insufficient documentation

## 2023-05-07 DIAGNOSIS — M2480 Other specific joint derangements of unspecified joint, not elsewhere classified: Secondary | ICD-10-CM | POA: Diagnosis not present

## 2023-05-07 DIAGNOSIS — M542 Cervicalgia: Secondary | ICD-10-CM | POA: Diagnosis present

## 2023-05-07 NOTE — ED Triage Notes (Signed)
 Patient arrives POV with complaints of of neck pain x1 week. Patient reports occasional "popping" sounds when moving his neck around. No falls or injuries. No pain on arrival.

## 2023-05-07 NOTE — ED Provider Notes (Signed)
 [place] EMERGENCY DEPARTMENT AT [facility] Provider Note   CSN: [id number] Arrival date & time: 05/07/23  1358     History  Chief Complaint  Patient presents with   Neck Pain    Antonio Washington. is a 73 y.o. male.  HPI Patient reports he has had neck symptoms for about 2 weeks.  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  He reports he is now starting to hear crackling sound in his neck as he moves it.  Reports sometimes he gets some pain that radiates into 1 shoulder or the other.  He is not having any constant or ongoing pain.  He does not currently have pain.  He reports he can look both ways and his neck is not locked up.  Patient reports he is concerned that he is starting to observe this symptom and does not want to get worse.  He has no weakness or numbness into his legs.  His gait is at baseline normal.  No headaches.  Patient reports he is also concerned that he has wax in his left ear and needs it removed.  No pain.  He reports sometimes trying OTC wax remover's without much success.  Patient reports he has an itchy rash on the side of his right knee.  This a dry spot has been there for about a year.  It waxes and wanes in terms of itching.  Sometimes he tries to put alcohol on it.  Sometimes he tries cortisone.  He is concerned because it has been there for so long.    Home Medications Prior to Admission medications   Medication Sig Start Date End Date Taking? Authorizing Provider  amLODipine-benazepril (LOTREL) 10-20 MG capsule Take 1 capsule by mouth daily. 06/27/21   Redwine, Madison A, PA-C  ammonium lactate (AMLACTIN) 12 % cream Apply 1 Application topically as needed for dry skin. 12/27/22   Vivi Barrack, DPM  atorvastatin (LIPITOR) 20 MG tablet Take 1 tablet (20 mg total) by mouth daily. 01/30/19   Doristine Bosworth, MD  Cholecalciferol (VITAMIN D) 50 MCG (2000 UT) tablet Take 1 tablet (2,000 Units total) by mouth  daily. 05/11/19   Doristine Bosworth, MD  ibuprofen (ADVIL) 600 MG tablet Take 1 tablet (600 mg total) by mouth every 6 (six) hours as needed. 06/27/21   Redwine, Madison A, PA-C  mupirocin ointment (BACTROBAN) 2 % Apply 1 application topically 3 (three) times daily. 11/03/17   Wallis Bamberg, PA-C  NON FORMULARY Vitamin D3 50 mcg (2,000 unit) tablet    [provider]      Allergies    Patient has no known allergies.    Review of Systems   Review of Systems  Physical Exam Updated Vital Signs BP (!) 172/78 (BP Location: Left Arm)   Pulse 78   Temp 97.8 F (36.6 C) (Temporal)   Resp 20   Ht 5\' 11"  (1.803 m)   Wt 92.5 kg   SpO2 100%   BMI 28.45 kg/m  Physical Exam Constitutional:      Comments: Alert nontoxic well in appearance.  No distress.  Well-nourished well-developed.  HENT:     Head: Normocephalic and atraumatic.     Ears:     Comments: Cerumen obstructing most of the left ear canal.    Nose: Nose normal.     Mouth/Throat:     Mouth: Mucous membranes are moist.     Pharynx:  Oropharynx is clear.  Eyes:     Extraocular Movements: Extraocular movements intact.  Neck:     Comments: No midline bony point tenderness.  No reproducible paraspinous muscle tenderness.  Intact range of motion.  Patient can flex and extend with good range of motion.  Also lateral motion good.  No palpable soft tissue abnormalities of the neck. Cardiovascular:     Rate and Rhythm: Normal rate and regular rhythm.  Pulmonary:     Effort: Pulmonary effort is normal.     Breath sounds: Normal breath sounds.  Musculoskeletal:     Comments: Patient is ambulatory without difficulty.  Bilateral upper extremities are symmetric.  Radial pulses 2+ and symmetric.  Grip strength 5\5.  Normal range of motion bilateral upper extremities.  Skin:    General: Skin is warm and dry.     Comments: Subtle hyperpigmented patch on the lateral aspect of the right knee.  About 8 x 5 cm.  Slightly dry.  No vesicles and  no erythema.  No swelling or effusion at the knee.  Neurological:     General: No focal deficit present.     Mental Status: He is oriented to person, place, and time.     Motor: No weakness.     Coordination: Coordination normal.     ED Results / Procedures / Treatments   Labs (all labs ordered are listed, but only abnormal results are displayed) Labs Reviewed - No data to display  EKG None  Radiology DG Cervical Spine 2-3 Views Result Date: 05/07/2023 CLINICAL DATA:  Neck pain, crepitus. EXAM: CERVICAL SPINE - 2-3 VIEW COMPARISON:  Cervical spine x-ray 11/11/2017 FINDINGS: There is no evidence of cervical spine fracture or prevertebral soft tissue swelling. Alignment is normal. No other significant bone abnormalities are identified. No definitive soft tissue gas identified. IMPRESSION: Negative cervical spine radiographs. Electronically Signed   By: Darliss Cheney M.D.   On: 05/07/2023 17:47    Procedures Procedures    Medications Ordered in ED Medications - No data to display  ED Course/ Medical Decision Making/ A&P                                 Medical Decision Making Amount and/or Complexity of Data Reviewed Radiology: ordered.   His chief complaint is crepitus and intermittent pain in the neck.  Exam does not show any neurologic dysfunction.  Patient is neurovascularly intact.  Pain is limited and intermittent.  Plain film x-rays obtained and no abnormalities identified.  Stable for continued outpatient management and workup.  I have counseled the patient on Tylenol for occasional pain.  I have also extensively counseled the patient on importance of core strength, flexibility and physical exercise for avoiding musculoskeletal problems with aging.  Patient also complains of cerumen obstruction in the left ear.  He has cerumen present.  He is not completely obstructed.  He requests removal.  He reports he is never able to adequately do so on his own at home.  Patient has  a very innocuous looking dry hyperpigmented rash on the right lateral knee.  At this time this does not require any urgent treatment.  I have suggested watching for any contact that he might be having frequently and recurrently in the area and use of topical over-the-counter hydrocortisone cream as needed with follow-up with PCP.        Final Clinical Impression(s) / ED Diagnoses Final diagnoses:  Cervical spine crepitus  Cervical muscle strain, initial encounter  Left ear impacted cerumen    Rx / DC Orders ED Discharge Orders     None         Arby Barrette, MD 05/07/23 [phone]

## 2023-05-07 NOTE — Discharge Instructions (Signed)
 1.  For occasional neck pain you may take extra strength Tylenol per package instructions. 2.  It is normal for people to experience popping and crackling sounds in joints as we age.  If there is no pain, no weakness, no numbness, this does not require treatment.  Good physical activity and strength with range of motion training is very important.  You had plain film x-rays done that did not show any abnormality.  Follow-up with your doctor for recheck for any concerning symptoms. 3.  Review earwax removal and irrigation.  You may use an over-the-counter earwax removal kit.  There are choices at the pharmacy that you can buy and use per package instructions.

## 2023-05-11 ENCOUNTER — Emergency Department (HOSPITAL_COMMUNITY)
Admission: EM | Admit: 2023-05-11 | Discharge: 2023-05-11 | Disposition: A | Discharge status: Home / Self Care | Attending: Emergency Medicine | Admitting: Emergency Medicine

## 2023-05-11 ENCOUNTER — Encounter (HOSPITAL_COMMUNITY): Payer: Self-pay

## 2023-05-11 ENCOUNTER — Other Ambulatory Visit: Payer: Self-pay

## 2023-05-11 DIAGNOSIS — I10 Essential (primary) hypertension: Secondary | ICD-10-CM | POA: Diagnosis not present

## 2023-05-11 DIAGNOSIS — N179 Acute kidney failure, unspecified: Secondary | ICD-10-CM | POA: Diagnosis not present

## 2023-05-11 DIAGNOSIS — Z79899 Other long term (current) drug therapy: Secondary | ICD-10-CM | POA: Diagnosis not present

## 2023-05-11 DIAGNOSIS — R110 Nausea: Secondary | ICD-10-CM | POA: Diagnosis present

## 2023-05-11 DIAGNOSIS — R197 Diarrhea, unspecified: Secondary | ICD-10-CM | POA: Insufficient documentation

## 2023-05-11 LAB — URINALYSIS, ROUTINE W REFLEX MICROSCOPIC
Bacteria, UA: NONE SEEN
Bilirubin Urine: NEGATIVE
Glucose, UA: NEGATIVE mg/dL
Hgb urine dipstick: NEGATIVE
Ketones, ur: NEGATIVE mg/dL
Leukocytes,Ua: NEGATIVE
Nitrite: NEGATIVE
Protein, ur: 30 mg/dL — AB
Specific Gravity, Urine: 1.03 (ref 1.005–1.030)
pH: 5 (ref 5.0–8.0)

## 2023-05-11 LAB — CBC
HCT: 56.1 % — ABNORMAL HIGH (ref 39.0–52.0)
Hemoglobin: 18.6 g/dL — ABNORMAL HIGH (ref 13.0–17.0)
MCH: 30 pg (ref 26.0–34.0)
MCHC: 33.2 g/dL (ref 30.0–36.0)
MCV: 90.5 fL (ref 80.0–100.0)
Platelets: 210 10*3/uL (ref 150–400)
RBC: 6.2 MIL/uL — ABNORMAL HIGH (ref 4.22–5.81)
RDW: 14.4 % (ref 11.5–15.5)
WBC: 4.8 10*3/uL (ref 4.0–10.5)
nRBC: 0 % (ref 0.0–0.2)

## 2023-05-11 LAB — COMPREHENSIVE METABOLIC PANEL
ALT: 32 U/L (ref 0–44)
AST: 22 U/L (ref 15–41)
Albumin: 4.2 g/dL (ref 3.5–5.0)
Alkaline Phosphatase: 48 U/L (ref 38–126)
Anion gap: 8 (ref 5–15)
BUN: 25 mg/dL — ABNORMAL HIGH (ref 8–23)
CO2: 23 mmol/L (ref 22–32)
Calcium: 8.9 mg/dL (ref 8.9–10.3)
Chloride: 107 mmol/L (ref 98–111)
Creatinine, Ser: 1.46 mg/dL — ABNORMAL HIGH (ref 0.61–1.24)
GFR, Estimated: 51 mL/min — ABNORMAL LOW (ref >60)
Glucose, Bld: 110 mg/dL — ABNORMAL HIGH (ref 70–99)
Potassium: 3.6 mmol/L (ref 3.5–5.1)
Sodium: 138 mmol/L (ref 135–145)
Total Bilirubin: 0.8 mg/dL (ref 0.0–1.2)
Total Protein: 7.9 g/dL (ref 6.5–8.1)

## 2023-05-11 LAB — LIPASE, BLOOD: Lipase: 41 U/L (ref 11–51)

## 2023-05-11 MED ORDER — ONDANSETRON HCL 4 MG/2ML IJ SOLN
4.0000 mg | Freq: Once | INTRAMUSCULAR | Status: AC
  Administered 2023-05-11: 4 mg via INTRAVENOUS
  Filled 2023-05-11: qty 2

## 2023-05-11 MED ORDER — SODIUM CHLORIDE 0.9 % IV BOLUS
1000.0000 mL | Freq: Once | INTRAVENOUS | Status: AC
  Administered 2023-05-11: 1000 mL via INTRAVENOUS

## 2023-05-11 NOTE — ED Notes (Signed)
 Pt aware urine and stool sample is needed.

## 2023-05-11 NOTE — ED Provider Notes (Signed)
 [place] [facility] AT [facility] Provider Note   CSN: [id number] Arrival date & time: 05/11/23  1027     History  Chief Complaint  Patient presents with   Diarrhea    Antonio Washington. is a 73 y.o. male.  Patient is a 73 year old male with a past medical history of hypertension presenting to the emergency department with nausea.  The patient states that he has been sick with diarrhea since Sunday.  He states that he feels like he is having 10 episodes of diarrhea in an hour.  He states that his stools are watery and denies any black or bloody stools.  He states he has associated nausea but denies any vomiting or abdominal pain.  He states he has felt like he has had low-grade fevers but is not measured his temperature at home.  He states that he has been feeling more weak and dehydrated.  He denies any recent hospitalization or antibiotic use and denies any recent travel.  He denies any known sick contacts but states that he has been around a lot of people on the bus and is unsure if he was exposed to someone there.  The history is provided by the patient.  Diarrhea      Home Medications Prior to Admission medications   Medication Sig Start Date End Date Taking? Authorizing Provider  amLODipine-benazepril (LOTREL) 10-20 MG capsule Take 1 capsule by mouth daily. 06/27/21   Redwine, Madison A, PA-C  ammonium lactate (AMLACTIN) 12 % cream Apply 1 Application topically as needed for dry skin. 12/27/22   Vivi Barrack, DPM  atorvastatin (LIPITOR) 20 MG tablet Take 1 tablet (20 mg total) by mouth daily. 01/30/19   Doristine Bosworth, MD  Cholecalciferol (VITAMIN D) 50 MCG (2000 UT) tablet Take 1 tablet (2,000 Units total) by mouth daily. 05/11/19   Doristine Bosworth, MD  ibuprofen (ADVIL) 600 MG tablet Take 1 tablet (600 mg total) by mouth every 6 (six) hours as needed. 06/27/21   Redwine, Madison A, PA-C  mupirocin ointment (BACTROBAN) 2 % Apply 1 application  topically 3 (three) times daily. 11/03/17   Wallis Bamberg, PA-C  NON FORMULARY Vitamin D3 50 mcg (2,000 unit) tablet    [provider]      Allergies    Patient has no known allergies.    Review of Systems   Review of Systems  Gastrointestinal:  Positive for diarrhea.    Physical Exam Updated Vital Signs BP (!) 143/80 (BP Location: Left Arm)   Pulse 70   Temp 97.9 F (36.6 C) (Oral)   Resp 17   Ht 5\' 11"  (1.803 m)   Wt 92 kg   SpO2 97%   BMI 28.29 kg/m  Physical Exam Vitals and nursing note reviewed.  Constitutional:      General: He is not in acute distress.    Appearance: Normal appearance.  HENT:     Head: Normocephalic and atraumatic.     Nose: Nose normal.     Mouth/Throat:     Mouth: Mucous membranes are moist.     Pharynx: Oropharynx is clear.  Eyes:     Extraocular Movements: Extraocular movements intact.     Conjunctiva/sclera: Conjunctivae normal.  Cardiovascular:     Rate and Rhythm: Normal rate and regular rhythm.     Heart sounds: Normal heart sounds.  Pulmonary:     Effort: Pulmonary effort is normal.     Breath sounds: Normal breath sounds.  Abdominal:     General: Abdomen is flat.     Palpations: Abdomen is soft.     Tenderness: There is no abdominal tenderness.  Musculoskeletal:        General: Normal range of motion.     Cervical back: Normal range of motion.  Skin:    General: Skin is warm and dry.  Neurological:     General: No focal deficit present.     Mental Status: He is alert and oriented to person, place, and time.  Psychiatric:        Mood and Affect: Mood normal.        Behavior: Behavior normal.     ED Results / Procedures / Treatments   Labs (all labs ordered are listed, but only abnormal results are displayed) Labs Reviewed  COMPREHENSIVE METABOLIC PANEL - Abnormal; Notable for the following components:      Result Value   Glucose, Bld 110 (*)    BUN 25 (*)    Creatinine, Ser 1.46 (*)    GFR, Estimated 51 (*)     All other components within normal limits  CBC - Abnormal; Notable for the following components:   RBC 6.20 (*)    Hemoglobin 18.6 (*)    HCT 56.1 (*)    All other components within normal limits  URINALYSIS, ROUTINE W REFLEX MICROSCOPIC - Abnormal; Notable for the following components:   APPearance HAZY (*)    Protein, ur 30 (*)    All other components within normal limits  C DIFFICILE QUICK SCREEN W PCR REFLEX    GASTROINTESTINAL PANEL BY PCR, STOOL (REPLACES STOOL CULTURE)  LIPASE, BLOOD    EKG None  Radiology No results found.  Procedures Procedures    Medications Ordered in ED Medications  sodium chloride 0.9 % bolus 1,000 mL (1,000 mLs Intravenous New Bag/Given 05/11/23 1404)  ondansetron (ZOFRAN) injection 4 mg (4 mg Intravenous Given 05/11/23 1404)    ED Course/ Medical Decision Making/ A&P Clinical Course as of 05/11/23 1456  Wed May 11, 2023  1455 No diarrhea since the patient has been here. He is stable for discharge home with outpatient follow up. [VK]    Clinical Course User Index [VK] Rexford Maus, DO                                 Medical Decision Making This patient presents to the ED with chief complaint(s) of diarrhea, weakness with pertinent past medical history of HTN which further complicates the presenting complaint. The complaint involves an extensive differential diagnosis and also carries with it a high risk of complications and morbidity.    The differential diagnosis includes dehydration, electrolyte abnormality, viral syndrome, C. difficile or other infectious diarrhea, gastroenteritis, no point abdominal tenderness making intra-abdominal infection unlikely  Additional history obtained: Additional history obtained from N/A Records reviewed N/A  ED Course and Reassessment: On patient's arrival he is hemodynamically stable in no acute distress.  Was initially evaluated in triage and had labs performed that showed mild AKI and  hemoconcentration.  The patient will be given IV fluids.  He has been in the emergency department for 3-1/2 hours and states he has had no diarrhea here making an infectious diarrhea less likely.  Will be given p.o. trial and likely will be stable for discharge home with symptomatic treatment.  Independent labs interpretation:  The following labs were independently interpreted: mild AKI, otherwise  within normal range  Independent visualization of imaging: - N/A  Consultation: - Consulted or discussed management/test interpretation w/ external professional: N/A  Consideration for admission or further workup: Patient has no emergent conditions requiring admission or further work-up at this time and is stable for discharge home with primary care follow-up  Social Determinants of health: N/A    Amount and/or Complexity of Data Reviewed Labs: ordered.  Risk Prescription drug management.          Final Clinical Impression(s) / ED Diagnoses Final diagnoses:  Diarrhea, unspecified type  AKI (acute kidney injury) [facility])    Rx / DC Orders ED Discharge Orders     None         Rexford Maus, DO 05/11/23 1456

## 2023-05-11 NOTE — Discharge Instructions (Addendum)
 You were seen in the emergency department for your diarrhea.  You appeared mildly dehydrated otherwise your labs are normal.  You likely have a viral infection and you can take over-the-counter Imodium as needed for diarrhea.  You can otherwise have a bland diet over the next few days until your diarrhea resolves.  You should follow-up with your primary doctor to have your symptoms and your kidney function rechecked.  You should return to the emergency department for worsening dehydration, you get lightheaded or pass out, you have severe abdominal pain or if you have any other new or concerning symptoms.

## 2023-05-11 NOTE — ED Triage Notes (Signed)
 Pt arrived reporting diarrhea and nausea for the last 4 days. Denies blood in stool. No other symptoms

## 2023-06-09 ENCOUNTER — Ambulatory Visit: Payer: BC Managed Care – PPO | Admitting: Family Medicine

## 2023-08-30 ENCOUNTER — Telehealth: Payer: Self-pay

## 2023-08-30 NOTE — Telephone Encounter (Signed)
 Patient answered NO to automated system called patient to confirm no answer left voicemail did let patient know we are cancelling appointment due to response on automated system

## 2023-08-31 ENCOUNTER — Ambulatory Visit: Admitting: Family Medicine

## 2023-10-18 ENCOUNTER — Other Ambulatory Visit (HOSPITAL_COMMUNITY): Payer: Self-pay
